# Patient Record
Sex: Female | Born: 1970 | Race: White | Hispanic: No | Marital: Married | Smoking: Never smoker
Health system: Southern US, Community
[De-identification: ages and names within clinical notes are randomized; demographics above are authoritative.]

## PROBLEM LIST (undated history)

## (undated) DIAGNOSIS — I8390 Asymptomatic varicose veins of unspecified lower extremity: Secondary | ICD-10-CM

## (undated) DIAGNOSIS — A6920 Lyme disease, unspecified: Secondary | ICD-10-CM

## (undated) DIAGNOSIS — Z8489 Family history of other specified conditions: Secondary | ICD-10-CM

## (undated) DIAGNOSIS — F419 Anxiety disorder, unspecified: Secondary | ICD-10-CM

## (undated) DIAGNOSIS — G4730 Sleep apnea, unspecified: Secondary | ICD-10-CM

## (undated) DIAGNOSIS — K219 Gastro-esophageal reflux disease without esophagitis: Secondary | ICD-10-CM

## (undated) DIAGNOSIS — Z8719 Personal history of other diseases of the digestive system: Secondary | ICD-10-CM

## (undated) HISTORY — DX: Lyme disease, unspecified: A69.20

## (undated) HISTORY — PX: NASAL SEPTUM SURGERY: SHX37

## (undated) HISTORY — DX: Asymptomatic varicose veins of unspecified lower extremity: I83.90

## (undated) HISTORY — PX: TEMPOROMANDIBULAR JOINT SURGERY: SHX35

---

## 1999-02-11 ENCOUNTER — Other Ambulatory Visit: Admission: RE | Admit: 1999-02-11 | Discharge: 1999-02-11 | Payer: Self-pay | Admitting: Obstetrics and Gynecology

## 1999-09-24 ENCOUNTER — Other Ambulatory Visit: Admission: RE | Admit: 1999-09-24 | Discharge: 1999-09-24 | Payer: Self-pay | Admitting: Obstetrics and Gynecology

## 2000-09-23 ENCOUNTER — Other Ambulatory Visit: Admission: RE | Admit: 2000-09-23 | Discharge: 2000-09-23 | Payer: Self-pay | Admitting: Obstetrics and Gynecology

## 2002-03-21 ENCOUNTER — Other Ambulatory Visit: Admission: RE | Admit: 2002-03-21 | Discharge: 2002-03-21 | Payer: Self-pay | Admitting: Obstetrics and Gynecology

## 2003-05-16 ENCOUNTER — Other Ambulatory Visit: Admission: RE | Admit: 2003-05-16 | Discharge: 2003-05-16 | Payer: Self-pay | Admitting: Obstetrics and Gynecology

## 2004-07-07 ENCOUNTER — Other Ambulatory Visit: Admission: RE | Admit: 2004-07-07 | Discharge: 2004-07-07 | Payer: Self-pay | Admitting: Obstetrics and Gynecology

## 2006-12-28 ENCOUNTER — Observation Stay (HOSPITAL_COMMUNITY): Admission: RE | Admit: 2006-12-28 | Discharge: 2006-12-29 | Payer: Self-pay | Admitting: Otolaryngology

## 2008-09-10 ENCOUNTER — Emergency Department (HOSPITAL_BASED_OUTPATIENT_CLINIC_OR_DEPARTMENT_OTHER): Admission: EM | Admit: 2008-09-10 | Discharge: 2008-09-10 | Payer: Self-pay | Admitting: Emergency Medicine

## 2008-09-10 ENCOUNTER — Ambulatory Visit: Payer: Self-pay | Admitting: Interventional Radiology

## 2010-05-16 LAB — D-DIMER, QUANTITATIVE: D-Dimer, Quant: 0.32 ug/mL-FEU (ref 0.00–0.48)

## 2010-05-16 LAB — CBC
HCT: 36.7 % (ref 36.0–46.0)
MCHC: 33.4 g/dL (ref 30.0–36.0)
MCV: 82.1 fL (ref 78.0–100.0)
Platelets: 248 10*3/uL (ref 150–400)
RDW: 13.6 % (ref 11.5–15.5)
WBC: 7.3 10*3/uL (ref 4.0–10.5)

## 2010-05-16 LAB — POCT CARDIAC MARKERS: Troponin i, poc: <0.05 ng/mL (ref 0.00–0.09)

## 2010-05-16 LAB — DIFFERENTIAL
Eosinophils Absolute: 0.1 10*3/uL (ref 0.0–0.7)
Eosinophils Relative: 2 % (ref 0–5)
Lymphs Abs: 3.3 10*3/uL (ref 0.7–4.0)

## 2010-06-23 NOTE — Op Note (Signed)
Michele Erickson, Michele Erickson                ACCOUNT NO.:  [id number]   MEDICAL RECORD NO.:  [id number]          PATIENT TYPE:  OBV   LOCATION:  3307                         FACILITY:  [facility]   PHYSICIAN:  Zola Button T. Lazarus Salines, M.D. DATE OF BIRTH:  Apr 27, 1970   DATE OF PROCEDURE:  12/28/2006  DATE OF DISCHARGE:  12/21/2006                               OPERATIVE REPORT   PREOPERATIVE DIAGNOSIS:  1. Obstructive sleep apnea.  2. Nasal septal deviation.  3. Hypertrophic inferior turbinates.   POSTOPERATIVE DIAGNOSIS:  1. Obstructive sleep apnea.  2. Nasal septal deviation.  3. Hypertrophic inferior turbinates.   PROCEDURE PERFORMED:  Nasal septoplasty, bilateral FMR inferior  turbinates.   SURGEON:  Gloris Manchester. Lazarus Salines, M.D.   ANESTHESIA:  General orotracheal.   BLOOD LOSS:  Minimal.   COMPLICATIONS:  None.   FINDINGS:  A severe buckled leftward septal deviation with a prominent  chondroethmoid spur and spurring along the maxillary crest as well.  Compensatory hypertrophy of the right inferior turbinate with moderate  hypertrophy of both inferior turbinates.   DESCRIPTION OF PROCEDURE:  With the patient in a comfortable supine  position, general orotracheal anesthesia was induced without difficulty.  At an appropriate level, the patient was placed in a slight sitting  position.  A saline-moistened throat pack was placed.  Nasal vibrissae  were trimmed.  Cocaine crystals, 200 mg total were applied on cotton  carriers to the anterior ethmoid and sphenopalatine ganglion regions on  both sides.  Cocaine solution, 160 mg total was applied on 1/2 x 3 inch  cottonoids to both sides of the septum.  Finally, 1% Xylocaine with  1:100,000 epinephrine, 10 mL total was infiltrated into the sub  mucoperichondrial plane of the septum on both sides, into the membranous  columella, into the anterior floor of the nose, and into the nasal spine  region.  Several minutes were allowed for this to take effect. A  sterile  preparation and draping in the mid face was accomplished.   The materials were removed from the nose and observed to be intact and  correct in number.  The findings were as described above.  A right-sided  approach was elected.   A small left floor incision was made and a right hemitransfixion  incision was executed and carried down into a floor incision. Floor  tunnels were elevated on both sides.  The sub mucoperichondrial plane of  the right septum was elevated up to the dorsum of the nose, back onto  the perpendicular plate of ethmoid and brought downward, communicated  with the floor tunnel and brought forward.  The right flap was raised  intact.  The chondroethmoid junction was identified and opened with a  Cottle elevator and the opposite sub mucoperiosteal plane of the  perpendicular plate of the septum was elevated.  The superior  perpendicular plate was lysed with an open Laren Boom forceps and  then piecemeal dissection more inferiorly allowed elevation of the  mucosa across the chondroethmoid spur which was then rocked free and  delivered. Additional bony spicules were carefully removed down to the  maxillary crest. Careful dissection at the junction of the perpendicular  plate with the quadrangular cartilage allowed better mobilization of the  quadrangular cartilage.  A small rent was made in the superior flap on  the left side which was felt to be not significant.   The posterior inferior corner of the quadrangular cartilage was  submucosally resected leaving a 3 cm dorsal strut and a 2 cm caudal  strut.  The inferior edge of the caudal strut was incised approximately  2 mm from the maxillary crest where it was heavily spurred and this also  was submucosally delivered.  The maxillary crest posterior to the caudal  strut was removed using mallet and osteotome.  At this point, the septum  still had a tendency to mobilize towards the left side.  Therefore,  the  septum was separated from the upper lateral cartilages sharply through  the tunnel on the right side and transmucosally on the left side.  This  allowed better mobilization.  The septum was secured to the nasal spine  with a figure-of-eight 4-0 PDS suture.  At this point,  the septum was  straight and in the midline.  The septal tunnels were suctioned free.  Hemostasis was observed.  The mucosal incisions were closed with  interrupted 4-0 chromic suture.   Just before completing the septoplasty, the inferior turbinates was  infiltrated with 1% Xylocaine with 1:100,000 epinephrine, 6 mL total.  After completing the septoplasty, beginning on the right side, the  anterior hood of the inferior turbinate was lysed just behind the nasal  valve.  The medial mucosa was incised in an anterior upsloping fashion  and a laterally based flap was developed.  The turbinate was  infractured.  Using angled turbinate scissors, the turbinate bone and  lateral mucosa were resected in a posterior downsloping fashion taking  virtually all the anterior pole and leaving virtually all the posterior  pole which was quite bulky. Dissection allowed delivery of additional  bony spicules.  The bulbous posterior pole of the inferior turbinate on  the cut mucosal edges were suction coagulated for hemostasis.  This  completed the turbinate which was outfractured.  The left side was done  in identical fashion.   At this point, 0.040 reinforced Silastic splints were fashioned and  placed into the nose and secured to the septum with a 3-0 Ethilon  stitch.  Again hemostasis was observed.  Measuring to the nasopharynx,  6.5 mm nasal trumpets were fashioned for each side.  A double thickness  bacitracin impregnated Telfa pack was applied along the inferior  turbinates on both sides and then the nasal trumpet was applied one on  each side to allow some postoperative airway.  At this point, the  procedure was completed.   Hemostasis was observed.  The pharynx was  suctioned clean and the throat pack was removed.  The patient was  returned to anesthesia, awakened, extubated, and transferred to recovery  in stable condition.   COMMENT:  A 40 year old white female with obstructive sleep apnea is a  candidate for septoplasty, reduction of turbinates, UPPP and  tonsillectomy.  The insurance company continues to refuse a  certification for the pharyngeal surgery.  The patient elected to go  ahead with the nasal surgery to see if this makes her CPAP tolerance  better, hence  the above procedure.  We will observe her 23 hours following anesthesia  given her severe obstructive sleep apnea and then remove the packs in  the  morning and allow her to go home if all is going well. Meanwhile  emphasize analgesia, antibiosis, ice, and elevation.  Will suction the  nasal tubes as needed to protect their patency.      Gloris Manchester. Lazarus Salines, M.D.  Electronically Signed     KTW/MEDQ  D:  12/28/2006  T:  12/28/2006  Job:  [id number]   cc:   Johnn Hai, MD

## 2010-11-17 LAB — CBC
HCT: 40.9
MCV: 85.9
RBC: 4.76
WBC: 7.2

## 2010-11-17 LAB — BASIC METABOLIC PANEL
BUN: 14
Chloride: 103
Potassium: 4.8

## 2010-12-03 ENCOUNTER — Other Ambulatory Visit: Payer: Self-pay | Admitting: Obstetrics and Gynecology

## 2010-12-03 DIAGNOSIS — R928 Other abnormal and inconclusive findings on diagnostic imaging of breast: Secondary | ICD-10-CM

## 2010-12-17 ENCOUNTER — Other Ambulatory Visit: Payer: Self-pay

## 2010-12-17 ENCOUNTER — Ambulatory Visit
Admission: RE | Admit: 2010-12-17 | Discharge: 2010-12-17 | Disposition: A | Discharge status: Home / Self Care | Payer: No Typology Code available for payment source | Source: Ambulatory Visit | Attending: Obstetrics and Gynecology | Admitting: Obstetrics and Gynecology

## 2010-12-17 DIAGNOSIS — R928 Other abnormal and inconclusive findings on diagnostic imaging of breast: Secondary | ICD-10-CM

## 2011-02-24 ENCOUNTER — Encounter (HOSPITAL_COMMUNITY): Payer: Self-pay | Admitting: *Deleted

## 2011-02-24 ENCOUNTER — Emergency Department (HOSPITAL_COMMUNITY)
Admission: EM | Admit: 2011-02-24 | Discharge: 2011-02-24 | Disposition: A | Discharge status: Home / Self Care | Payer: No Typology Code available for payment source | Attending: Emergency Medicine | Admitting: Emergency Medicine

## 2011-02-24 ENCOUNTER — Emergency Department (HOSPITAL_COMMUNITY): Payer: No Typology Code available for payment source

## 2011-02-24 DIAGNOSIS — S335XXA Sprain of ligaments of lumbar spine, initial encounter: Secondary | ICD-10-CM | POA: Insufficient documentation

## 2011-02-24 DIAGNOSIS — M542 Cervicalgia: Secondary | ICD-10-CM | POA: Insufficient documentation

## 2011-02-24 DIAGNOSIS — M545 Low back pain, unspecified: Secondary | ICD-10-CM | POA: Insufficient documentation

## 2011-02-24 DIAGNOSIS — S39012A Strain of muscle, fascia and tendon of lower back, initial encounter: Secondary | ICD-10-CM

## 2011-02-24 DIAGNOSIS — S139XXA Sprain of joints and ligaments of unspecified parts of neck, initial encounter: Secondary | ICD-10-CM | POA: Insufficient documentation

## 2011-02-24 DIAGNOSIS — S161XXA Strain of muscle, fascia and tendon at neck level, initial encounter: Secondary | ICD-10-CM

## 2011-02-24 HISTORY — DX: Anxiety disorder, unspecified: F41.9

## 2011-02-24 MED ORDER — HYDROCODONE-ACETAMINOPHEN 5-325 MG PO TABS: 1.0000 | ORAL_TABLET | Freq: Four times a day (QID) | ORAL | Status: AC | PRN

## 2011-02-24 MED ORDER — IBUPROFEN 800 MG PO TABS: 800.0000 mg | ORAL_TABLET | Freq: Three times a day (TID) | ORAL | Status: AC | PRN

## 2011-02-24 MED ORDER — CYCLOBENZAPRINE HCL 10 MG PO TABS: 10.0000 mg | ORAL_TABLET | Freq: Three times a day (TID) | ORAL | Status: AC | PRN

## 2011-02-24 NOTE — ED Provider Notes (Signed)
Medical screening examination/treatment/procedure(s) were performed by non-physician practitioner and as supervising physician I was immediately available for consultation/collaboration.   Dione Booze, MD 02/24/11 [phone]
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
## 2013-09-20 ENCOUNTER — Ambulatory Visit (INDEPENDENT_AMBULATORY_CARE_PROVIDER_SITE_OTHER): Payer: BC Managed Care – HMO | Admitting: General Surgery

## 2013-10-09 ENCOUNTER — Ambulatory Visit (INDEPENDENT_AMBULATORY_CARE_PROVIDER_SITE_OTHER): Payer: BC Managed Care – HMO | Admitting: General Surgery

## 2013-10-29 ENCOUNTER — Ambulatory Visit (HOSPITAL_COMMUNITY)
Admission: RE | Admit: 2013-10-29 | Discharge: 2013-10-29 | Disposition: A | Discharge status: Home / Self Care | Payer: BC Managed Care – PPO | Source: Ambulatory Visit | Attending: Gastroenterology | Admitting: Gastroenterology

## 2013-10-29 ENCOUNTER — Encounter (HOSPITAL_COMMUNITY)
Admission: RE | Disposition: A | Discharge status: Home / Self Care | Payer: Self-pay | Source: Ambulatory Visit | Attending: Gastroenterology

## 2013-10-29 DIAGNOSIS — K219 Gastro-esophageal reflux disease without esophagitis: Secondary | ICD-10-CM | POA: Diagnosis not present

## 2013-10-29 DIAGNOSIS — R12 Heartburn: Secondary | ICD-10-CM | POA: Insufficient documentation

## 2013-10-29 HISTORY — PX: 24 HOUR PH STUDY: SHX5419

## 2013-10-29 SURGERY — MONITORING, ESOPHAGEAL PH, 24 HOUR

## 2013-10-29 MED ORDER — LIDOCAINE VISCOUS 2 % MT SOLN
OROMUCOSAL | Status: AC
  Filled 2013-10-29: qty 15

## 2013-10-30 ENCOUNTER — Encounter (HOSPITAL_COMMUNITY): Payer: Self-pay | Admitting: Gastroenterology

## 2013-11-14 ENCOUNTER — Other Ambulatory Visit (INDEPENDENT_AMBULATORY_CARE_PROVIDER_SITE_OTHER): Payer: Self-pay | Admitting: General Surgery

## 2013-12-19 NOTE — Patient Instructions (Addendum)
Michele Erickson  12/19/2013   Your procedure is scheduled on:  12/27/13                Come thru the [facility] Entrance.    Follow the Signs to Short Stay Center at  0700      am  Call this number if you have problems the morning of surgery: [phone]   Remember:   Do not eat food or drink liquids after midnight.   Take these medicines the morning of surgery with A SIP OF WATER: none    Do not wear jewelry, make-up or nail polish.  Do not wear lotions, powders, or perfumes.  deodorant.  Do not shave 48 hours prior to surgery.   Do not bring valuables to the hospital.  Contacts, dentures or bridgework may not be worn into surgery.  Leave suitcase in the car. After surgery it may be brought to your room.  For patients admitted to the hospital, checkout time is 11:00 AM the day of  discharge.         Please read over the following fact sheets that you were given: coughing and deep breathing exercises, leg exercises            [place] - Preparing for Surgery Before surgery, you can play an important role.  Because skin is not sterile, your skin needs to be as free of germs as possible.  You can reduce the number of germs on your skin by washing with CHG (chlorahexidine gluconate) soap before surgery.  CHG is an antiseptic cleaner which kills germs and bonds with the skin to continue killing germs even after washing. Please DO NOT use if you have an allergy to CHG or antibacterial soaps.  If your skin becomes reddened/irritated stop using the CHG and inform your nurse when you arrive at Short Stay. Do not shave (including legs and underarms) for at least 48 hours prior to the first CHG shower.  You may shave your face/neck. Please follow these instructions carefully:  1.  Shower with CHG Soap the night before surgery and the  morning of Surgery.  2.  If you choose to wash your hair, wash your hair first as usual with your  normal  shampoo.  3.  After you shampoo, rinse your hair  and body thoroughly to remove the  shampoo.                           4.  Use CHG as you would any other liquid soap.  You can apply chg directly  to the skin and wash                       Gently with a scrungie or clean washcloth.  5.  Apply the CHG Soap to your body ONLY FROM THE NECK DOWN.   Do not use on face/ open                           Wound or open sores. Avoid contact with eyes, ears mouth and genitals (private parts).                       Wash face,  Genitals (private parts) with your normal soap.             6.  Wash thoroughly, paying special attention to  the area where your surgery  will be performed.  7.  Thoroughly rinse your body with warm water from the neck down.  8.  DO NOT shower/wash with your normal soap after using and rinsing off  the CHG Soap.                9.  Pat yourself dry with a clean towel.            10.  Wear clean pajamas.            11.  Place clean sheets on your bed the night of your first shower and do not  sleep with pets. Day of Surgery : Do not apply any lotions/deodorants the morning of surgery.  Please wear clean clothes to the hospital/surgery center.  FAILURE TO FOLLOW THESE INSTRUCTIONS MAY RESULT IN THE CANCELLATION OF YOUR SURGERY PATIENT SIGNATURE_________________________________  NURSE SIGNATURE__________________________________  ________________________________________________________________________  WHAT IS A BLOOD TRANSFUSION? Blood Transfusion Information  A transfusion is the replacement of blood or some of its parts. Blood is made up of multiple cells which provide different functions.  Red blood cells carry oxygen and are used for blood loss replacement.  White blood cells fight against infection.  Platelets control bleeding.  Plasma helps clot blood.  Other blood products are available for specialized needs, such as hemophilia or other clotting disorders. BEFORE THE TRANSFUSION  Who gives blood for transfusions?    Healthy volunteers who are fully evaluated to make sure their blood is safe. This is blood bank blood. Transfusion therapy is the safest it has ever been in the practice of medicine. Before blood is taken from a donor, a complete history is taken to make sure that person has no history of diseases nor engages in risky social behavior (examples are intravenous drug use or sexual activity with multiple partners). The donor's travel history is screened to minimize risk of transmitting infections, such as malaria. The donated blood is tested for signs of infectious diseases, such as HIV and hepatitis. The blood is then tested to be sure it is compatible with you in order to minimize the chance of a transfusion reaction. If you or a relative donates blood, this is often done in anticipation of surgery and is not appropriate for emergency situations. It takes many days to process the donated blood. RISKS AND COMPLICATIONS Although transfusion therapy is very safe and saves many lives, the main dangers of transfusion include:   Getting an infectious disease.  Developing a transfusion reaction. This is an allergic reaction to something in the blood you were given. Every precaution is taken to prevent this. The decision to have a blood transfusion has been considered carefully by your caregiver before blood is given. Blood is not given unless the benefits outweigh the risks. AFTER THE TRANSFUSION  Right after receiving a blood transfusion, you will usually feel much better and more energetic. This is especially true if your red blood cells have gotten low (anemic). The transfusion raises the level of the red blood cells which carry oxygen, and this usually causes an energy increase.  The nurse administering the transfusion will monitor you carefully for complications. HOME CARE INSTRUCTIONS  No special instructions are needed after a transfusion. You may find your energy is better. Speak with your  caregiver about any limitations on activity for underlying diseases you may have. SEEK MEDICAL CARE IF:   Your condition is not improving after your transfusion.  You develop redness or irritation at  the intravenous (IV) site. SEEK IMMEDIATE MEDICAL CARE IF:  Any of the following symptoms occur over the next 12 hours:  Shaking chills.  You have a temperature by mouth above 102 F (38.9 C), not controlled by medicine.  Chest, back, or muscle pain.  People around you feel you are not acting correctly or are confused.  Shortness of breath or difficulty breathing.  Dizziness and fainting.  You get a rash or develop hives.  You have a decrease in urine output.  Your urine turns a dark color or changes to pink, red, or brown. Any of the following symptoms occur over the next 10 days:  You have a temperature by mouth above 102 F (38.9 C), not controlled by medicine.  Shortness of breath.  Weakness after normal activity.  The white part of the eye turns yellow (jaundice).  You have a decrease in the amount of urine or are urinating less often.  Your urine turns a dark color or changes to pink, red, or brown. Document Released: 01/23/2000 Document Revised: 04/19/2011 Document Reviewed: 09/11/2007 [facility] Patient Information 2014 [place], [place].  _______________________________________________________________________

## 2013-12-20 ENCOUNTER — Encounter (HOSPITAL_COMMUNITY)
Admission: RE | Admit: 2013-12-20 | Discharge: 2013-12-20 | Disposition: A | Discharge status: Home / Self Care | Payer: BC Managed Care – PPO | Source: Ambulatory Visit | Attending: General Surgery | Admitting: General Surgery

## 2013-12-20 ENCOUNTER — Encounter (HOSPITAL_COMMUNITY): Payer: Self-pay

## 2013-12-20 DIAGNOSIS — Z01812 Encounter for preprocedural laboratory examination: Secondary | ICD-10-CM | POA: Diagnosis not present

## 2013-12-20 HISTORY — DX: Gastro-esophageal reflux disease without esophagitis: K21.9

## 2013-12-20 HISTORY — DX: Family history of other specified conditions: Z84.89

## 2013-12-20 HISTORY — DX: Sleep apnea, unspecified: G47.30

## 2013-12-20 HISTORY — DX: Personal history of other diseases of the digestive system: Z87.19

## 2013-12-20 LAB — CBC WITH DIFFERENTIAL/PLATELET
BASOS ABS: 0 10*3/uL (ref 0.0–0.1)
Basophils Relative: 0 % (ref 0–1)
EOS ABS: 0.1 10*3/uL (ref 0.0–0.7)
EOS PCT: 1 % (ref 0–5)
HEMATOCRIT: 41.4 % (ref 36.0–46.0)
Hemoglobin: 13.8 g/dL (ref 12.0–15.0)
LYMPHS PCT: 32 % (ref 12–46)
Lymphs Abs: 2.2 10*3/uL (ref 0.7–4.0)
MCH: 28.2 pg (ref 26.0–34.0)
MCHC: 33.3 g/dL (ref 30.0–36.0)
MCV: 84.7 fL (ref 78.0–100.0)
MONO ABS: 0.4 10*3/uL (ref 0.1–1.0)
Monocytes Relative: 6 % (ref 3–12)
Neutro Abs: 4.1 10*3/uL (ref 1.7–7.7)
Neutrophils Relative %: 61 % (ref 43–77)
Platelets: 227 10*3/uL (ref 150–400)
RBC: 4.89 MIL/uL (ref 3.87–5.11)
RDW: 15.3 % (ref 11.5–15.5)
WBC: 6.7 10*3/uL (ref 4.0–10.5)

## 2013-12-20 LAB — COMPREHENSIVE METABOLIC PANEL
ALT: 13 U/L (ref 0–35)
ANION GAP: 12 (ref 5–15)
AST: 14 U/L (ref 0–37)
Albumin: 4.5 g/dL (ref 3.5–5.2)
Alkaline Phosphatase: 62 U/L (ref 39–117)
BUN: 14 mg/dL (ref 6–23)
CALCIUM: 9.8 mg/dL (ref 8.4–10.5)
CO2: 26 mEq/L (ref 19–32)
CREATININE: 0.85 mg/dL (ref 0.50–1.10)
Chloride: 103 mEq/L (ref 96–112)
GFR calc non Af Amer: 82 mL/min — ABNORMAL LOW (ref >90)
GLUCOSE: 85 mg/dL (ref 70–99)
Potassium: 4.2 mEq/L (ref 3.7–5.3)
Sodium: 141 mEq/L (ref 137–147)
TOTAL PROTEIN: 8.2 g/dL (ref 6.0–8.3)
Total Bilirubin: 0.4 mg/dL (ref 0.3–1.2)

## 2013-12-20 LAB — HCG, SERUM, QUALITATIVE: Preg, Serum: NEGATIVE

## 2013-12-20 LAB — ABO/RH: ABO/RH(D): O POS

## 2013-12-20 LAB — PROTIME-INR
INR: 0.98 (ref 0.00–1.49)
Prothrombin Time: 13.1 s (ref 11.6–15.2)

## 2013-12-20 NOTE — Progress Notes (Signed)
Temp 99.7 at time of preop appointment.   Patient would prefer " to have no injections for blood clots".  After surgery.

## 2013-12-27 ENCOUNTER — Observation Stay (HOSPITAL_COMMUNITY)
Admission: RE | Admit: 2013-12-27 | Discharge: 2013-12-28 | Disposition: A | Discharge status: Home / Self Care | Payer: BC Managed Care – PPO | Source: Ambulatory Visit | Attending: General Surgery | Admitting: General Surgery

## 2013-12-27 ENCOUNTER — Encounter (HOSPITAL_COMMUNITY): Payer: Self-pay | Admitting: *Deleted

## 2013-12-27 ENCOUNTER — Ambulatory Visit (HOSPITAL_COMMUNITY): Payer: BC Managed Care – PPO | Admitting: Certified Registered Nurse Anesthetist

## 2013-12-27 ENCOUNTER — Encounter (HOSPITAL_COMMUNITY)
Admission: RE | Disposition: A | Discharge status: Home / Self Care | Payer: Self-pay | Source: Ambulatory Visit | Attending: General Surgery

## 2013-12-27 DIAGNOSIS — G4730 Sleep apnea, unspecified: Secondary | ICD-10-CM | POA: Insufficient documentation

## 2013-12-27 DIAGNOSIS — K219 Gastro-esophageal reflux disease without esophagitis: Secondary | ICD-10-CM | POA: Diagnosis present

## 2013-12-27 DIAGNOSIS — M549 Dorsalgia, unspecified: Secondary | ICD-10-CM | POA: Diagnosis not present

## 2013-12-27 DIAGNOSIS — K449 Diaphragmatic hernia without obstruction or gangrene: Secondary | ICD-10-CM | POA: Insufficient documentation

## 2013-12-27 HISTORY — PX: LAPAROSCOPIC NISSEN FUNDOPLICATION: SHX1932

## 2013-12-27 LAB — TYPE AND SCREEN
ABO/RH(D): O POS
Antibody Screen: NEGATIVE

## 2013-12-27 SURGERY — FUNDOPLICATION, NISSEN, LAPAROSCOPIC
Anesthesia: General

## 2013-12-27 MED ORDER — FENTANYL CITRATE 0.05 MG/ML IJ SOLN
INTRAMUSCULAR | Status: AC
  Filled 2013-12-27: qty 5

## 2013-12-27 MED ORDER — ONDANSETRON HCL 4 MG/2ML IJ SOLN: 4.0000 mg | INTRAMUSCULAR | Status: DC | PRN

## 2013-12-27 MED ORDER — ONDANSETRON HCL 4 MG/2ML IJ SOLN
INTRAMUSCULAR | Status: DC | PRN
  Administered 2013-12-27: 4 mg via INTRAVENOUS

## 2013-12-27 MED ORDER — PROMETHAZINE HCL 25 MG/ML IJ SOLN: 6.2500 mg | INTRAMUSCULAR | Status: DC | PRN

## 2013-12-27 MED ORDER — ONDANSETRON HCL 4 MG/2ML IJ SOLN
INTRAMUSCULAR | Status: AC
  Filled 2013-12-27: qty 2

## 2013-12-27 MED ORDER — FENTANYL CITRATE 0.05 MG/ML IJ SOLN
INTRAMUSCULAR | Status: DC | PRN
  Administered 2013-12-27 (×5): 50 ug via INTRAVENOUS

## 2013-12-27 MED ORDER — GLYCOPYRROLATE 0.2 MG/ML IJ SOLN
INTRAMUSCULAR | Status: AC
  Filled 2013-12-27: qty 3

## 2013-12-27 MED ORDER — VANCOMYCIN HCL IN DEXTROSE 1-5 GM/200ML-% IV SOLN
1000.0000 mg | INTRAVENOUS | Status: AC
  Administered 2013-12-27: 1000 mg via INTRAVENOUS

## 2013-12-27 MED ORDER — ROCURONIUM BROMIDE 100 MG/10ML IV SOLN
INTRAVENOUS | Status: AC
  Filled 2013-12-27: qty 1

## 2013-12-27 MED ORDER — LIDOCAINE HCL (CARDIAC) 20 MG/ML IV SOLN
INTRAVENOUS | Status: AC
  Filled 2013-12-27: qty 5

## 2013-12-27 MED ORDER — LACTATED RINGERS IR SOLN
Status: DC | PRN
  Administered 2013-12-27: 1

## 2013-12-27 MED ORDER — HYDROMORPHONE HCL 1 MG/ML IJ SOLN
INTRAMUSCULAR | Status: AC
  Filled 2013-12-27: qty 1

## 2013-12-27 MED ORDER — PROPOFOL 10 MG/ML IV BOLUS
INTRAVENOUS | Status: AC
  Filled 2013-12-27: qty 20

## 2013-12-27 MED ORDER — PROPOFOL 10 MG/ML IV BOLUS
INTRAVENOUS | Status: DC | PRN
  Administered 2013-12-27: 150 mg via INTRAVENOUS

## 2013-12-27 MED ORDER — 0.9 % SODIUM CHLORIDE (POUR BTL) OPTIME
TOPICAL | Status: DC | PRN
  Administered 2013-12-27: 1000 mL

## 2013-12-27 MED ORDER — NEOSTIGMINE METHYLSULFATE 10 MG/10ML IV SOLN
INTRAVENOUS | Status: AC
  Filled 2013-12-27: qty 1

## 2013-12-27 MED ORDER — VANCOMYCIN HCL IN DEXTROSE 1-5 GM/200ML-% IV SOLN
INTRAVENOUS | Status: AC
  Filled 2013-12-27: qty 200

## 2013-12-27 MED ORDER — LIDOCAINE HCL (CARDIAC) 20 MG/ML IV SOLN
INTRAVENOUS | Status: DC | PRN
  Administered 2013-12-27: 70 mg via INTRAVENOUS
  Administered 2013-12-27: 30 mg via INTRAVENOUS

## 2013-12-27 MED ORDER — ROCURONIUM BROMIDE 100 MG/10ML IV SOLN
INTRAVENOUS | Status: DC | PRN
  Administered 2013-12-27: 10 mg via INTRAVENOUS
  Administered 2013-12-27: 40 mg via INTRAVENOUS

## 2013-12-27 MED ORDER — BUPIVACAINE-EPINEPHRINE (PF) 0.5% -1:200000 IJ SOLN
INTRAMUSCULAR | Status: DC | PRN
  Administered 2013-12-27: 11 mL via PERINEURAL

## 2013-12-27 MED ORDER — OXYCODONE HCL 5 MG PO TABS
5.0000 mg | ORAL_TABLET | ORAL | Status: DC | PRN
  Administered 2013-12-27 – 2013-12-28 (×5): 10 mg via ORAL
  Filled 2013-12-27 (×5): qty 2

## 2013-12-27 MED ORDER — LACTATED RINGERS IV SOLN
INTRAVENOUS | Status: DC
  Administered 2013-12-27: 11:00:00 via INTRAVENOUS
  Administered 2013-12-27: 1000 mL via INTRAVENOUS
  Administered 2013-12-27: 10:00:00 via INTRAVENOUS

## 2013-12-27 MED ORDER — PROMETHAZINE HCL 25 MG/ML IJ SOLN: 12.5000 mg | INTRAMUSCULAR | Status: DC | PRN

## 2013-12-27 MED ORDER — ONDANSETRON HCL 4 MG/2ML IJ SOLN
4.0000 mg | INTRAMUSCULAR | Status: AC
  Administered 2013-12-27 (×3): 4 mg via INTRAVENOUS
  Filled 2013-12-27 (×4): qty 2

## 2013-12-27 MED ORDER — BUPIVACAINE-EPINEPHRINE (PF) 0.5% -1:200000 IJ SOLN
INTRAMUSCULAR | Status: AC
  Filled 2013-12-27: qty 30

## 2013-12-27 MED ORDER — KETOROLAC TROMETHAMINE 30 MG/ML IJ SOLN
15.0000 mg | Freq: Once | INTRAMUSCULAR | Status: AC | PRN
  Administered 2013-12-27: 30 mg via INTRAVENOUS

## 2013-12-27 MED ORDER — SUCCINYLCHOLINE CHLORIDE 20 MG/ML IJ SOLN
INTRAMUSCULAR | Status: DC | PRN
  Administered 2013-12-27: 100 mg via INTRAVENOUS

## 2013-12-27 MED ORDER — NEOSTIGMINE METHYLSULFATE 10 MG/10ML IV SOLN
INTRAVENOUS | Status: DC | PRN
  Administered 2013-12-27: 3.5 mg via INTRAVENOUS

## 2013-12-27 MED ORDER — MIDAZOLAM HCL 2 MG/2ML IJ SOLN
INTRAMUSCULAR | Status: AC
  Filled 2013-12-27: qty 2

## 2013-12-27 MED ORDER — GLYCOPYRROLATE 0.2 MG/ML IJ SOLN
INTRAMUSCULAR | Status: DC | PRN
  Administered 2013-12-27: 0.6 mg via INTRAVENOUS

## 2013-12-27 MED ORDER — MORPHINE SULFATE 2 MG/ML IJ SOLN: 2.0000 mg | INTRAMUSCULAR | Status: DC | PRN

## 2013-12-27 MED ORDER — MIDAZOLAM HCL 5 MG/5ML IJ SOLN
INTRAMUSCULAR | Status: DC | PRN
  Administered 2013-12-27: 2 mg via INTRAVENOUS

## 2013-12-27 MED ORDER — DEXAMETHASONE SODIUM PHOSPHATE 10 MG/ML IJ SOLN
INTRAMUSCULAR | Status: AC
  Filled 2013-12-27: qty 1

## 2013-12-27 MED ORDER — ONDANSETRON HCL 4 MG PO TABS: 4.0000 mg | ORAL_TABLET | ORAL | Status: AC

## 2013-12-27 MED ORDER — HYDROMORPHONE HCL 1 MG/ML IJ SOLN
0.2500 mg | INTRAMUSCULAR | Status: DC | PRN
  Administered 2013-12-27 (×4): 0.5 mg via INTRAVENOUS

## 2013-12-27 MED ORDER — KETOROLAC TROMETHAMINE 30 MG/ML IJ SOLN
INTRAMUSCULAR | Status: AC
  Filled 2013-12-27: qty 1

## 2013-12-27 MED ORDER — KCL-LACTATED RINGERS-D5W 20 MEQ/L IV SOLN
INTRAVENOUS | Status: DC
  Administered 2013-12-27: 100 mL/h via INTRAVENOUS
  Administered 2013-12-28: 03:00:00 via INTRAVENOUS
  Filled 2013-12-27 (×4): qty 1000

## 2013-12-27 MED ORDER — DEXAMETHASONE SODIUM PHOSPHATE 10 MG/ML IJ SOLN
INTRAMUSCULAR | Status: DC | PRN
  Administered 2013-12-27: 10 mg via INTRAVENOUS

## 2013-12-27 SURGICAL SUPPLY — 51 items
APL SKNCLS STERI-STRIP NONHPOA (GAUZE/BANDAGES/DRESSINGS) ×1
APPLIER CLIP ROT 10 11.4 M/L (STAPLE)
APR CLP MED LRG 11.4X10 (STAPLE)
BENZOIN TINCTURE PRP APPL 2/3 (GAUZE/BANDAGES/DRESSINGS) ×2 IMPLANT
CANISTER SUCTION 2500CC (MISCELLANEOUS) ×1 IMPLANT
CHLORAPREP W/TINT 26ML (MISCELLANEOUS) ×1 IMPLANT
CLAMP ENDO BABCK 10MM (STAPLE) IMPLANT
CLIP APPLIE ROT 10 11.4 M/L (STAPLE) IMPLANT
DECANTER SPIKE VIAL GLASS SM (MISCELLANEOUS) ×1 IMPLANT
DEVICE SUT QUICK LOAD TK 5 (STAPLE) ×6 IMPLANT
DEVICE SUT TI-KNOT TK 5X26 (MISCELLANEOUS) ×1 IMPLANT
DEVICE SUTURE ENDOST 10MM (ENDOMECHANICALS) ×2 IMPLANT
DISSECTOR BLUNT TIP ENDO 5MM (MISCELLANEOUS) ×2 IMPLANT
DRAIN PENROSE 18X1/2 LTX STRL (DRAIN) ×2 IMPLANT
DRAPE LAPAROSCOPIC ABDOMINAL (DRAPES) ×2 IMPLANT
DRSG TEGADERM 2-3/8X2-3/4 SM (GAUZE/BANDAGES/DRESSINGS) ×12 IMPLANT
ELECT REM PT RETURN 9FT ADLT (ELECTROSURGICAL) ×2
ELECTRODE REM PT RTRN 9FT ADLT (ELECTROSURGICAL) ×1 IMPLANT
FELT TEFLON 4 X1 (Mesh General) ×2 IMPLANT
FILTER SMOKE EVAC LAPAROSHD (FILTER) ×2 IMPLANT
GLOVE ECLIPSE 8.0 STRL XLNG CF (GLOVE) ×4 IMPLANT
GLOVE INDICATOR 8.0 STRL GRN (GLOVE) ×4 IMPLANT
GOWN STRL REUS W/TWL LRG LVL3 (GOWN DISPOSABLE) ×2 IMPLANT
GOWN STRL REUS W/TWL XL LVL3 (GOWN DISPOSABLE) ×6 IMPLANT
GRASPER ENDO BABCOCK 10 (MISCELLANEOUS) IMPLANT
GRASPER ENDO BABCOCK 10MM (MISCELLANEOUS)
KIT BASIN OR (CUSTOM PROCEDURE TRAY) ×2 IMPLANT
NS IRRIG 1000ML POUR BTL (IV SOLUTION) ×2 IMPLANT
PENCIL BUTTON HOLSTER BLD 10FT (ELECTRODE) IMPLANT
RETRACTOR LAPSCP 12X46 CVD (ENDOMECHANICALS) IMPLANT
RTRCTR LAPSCP 12X46 CVD (ENDOMECHANICALS)
SET IRRIG TUBING LAPAROSCOPIC (IRRIGATION / IRRIGATOR) ×2 IMPLANT
SHEARS HARMONIC ACE PLUS 36CM (ENDOMECHANICALS) ×2 IMPLANT
SOLUTION ANTI FOG 6CC (MISCELLANEOUS) ×2 IMPLANT
STAPLER VISISTAT 35W (STAPLE) IMPLANT
STRIP CLOSURE SKIN 1/2X4 (GAUZE/BANDAGES/DRESSINGS) IMPLANT
SUT MNCRL AB 4-0 PS2 18 (SUTURE) ×2 IMPLANT
SUT SURGIDAC NAB ES-9 0 48 120 (SUTURE) ×9 IMPLANT
TIP INNERVISION DETACH 40FR (MISCELLANEOUS) IMPLANT
TIP INNERVISION DETACH 50FR (MISCELLANEOUS) IMPLANT
TIP INNERVISION DETACH 56FR (MISCELLANEOUS) ×2 IMPLANT
TIPS INNERVISION DETACH 40FR (MISCELLANEOUS)
TOWEL OR 17X26 10 PK STRL BLUE (TOWEL DISPOSABLE) ×4 IMPLANT
TOWEL OR NON WOVEN STRL DISP B (DISPOSABLE) ×2 IMPLANT
TRAY FOLEY CATH 14FRSI W/METER (CATHETERS) ×3 IMPLANT
TRAY LAPAROSCOPIC (CUSTOM PROCEDURE TRAY) ×2 IMPLANT
TROCAR BLADELESS OPT 5 75 (ENDOMECHANICALS) ×4 IMPLANT
TROCAR XCEL BLUNT TIP 100MML (ENDOMECHANICALS) ×1 IMPLANT
TROCAR XCEL NON-BLD 11X100MML (ENDOMECHANICALS) ×2 IMPLANT
TROCAR XCEL UNIV SLVE 11M 100M (ENDOMECHANICALS) ×3 IMPLANT
TUBING INSUFFLATION 10FT LAP (TUBING) ×2 IMPLANT

## 2013-12-27 NOTE — Op Note (Signed)
Operative Note  Michele BroadSabrina A Erickson female 43 y.o. 12/27/2013  PREOPERATIVE DX:  GERD  POSTOPERATIVE DX:  Same with small hiatal hernia.  PROCEDURE:   Laparoscopic hiatal hernia repair and Nissen fundoplication         Surgeon: Adolph PollackOSENBOWER,Allan Minotti J   Assistants: Karie SodaSteven Gross, M.D.  Anesthesia: General endotracheal anesthesia  Indications:   This is a 43 year old female with medically refractory GERD who present for the above operation.    Procedure Detail:  She was brought to the operating room placed supine on the operating table and a general anesthetic was administered. An oral gastric tube and Foley catheter were inserted. The abdominal wall was widely sterilely prepped and draped.  A 5 mm incision was made in the left subcostal area and she was placed in slight reverse Trendelenburg position. Using a 5 mm Optiview trocar and laparoscope access was gained into the peritoneal cavity and a pneumoperitoneum was created. Inspection of the area under the trocar demonstrated no evidence of organ injury or bleeding. Under direct laparoscopic vision a 5 mm trocar was placed in the right upper quadrant.  An 11 mm trocar was placed just to the left of the umbilicus. An 11 mm trocar placed through an epigastric incision. A 5 mm trocar placed in the left upper quadrant lateral to the first left upper quadrant trocar. A 5 mm incision was made in the subxiphoid area and a liver retractor was placed through that incision.  The left lobe of the liver was retracted anteriorly exposing the hiatus. A small hiatal hernia was noted.  The thin gastrohepatic ligament was divided with the Harmonic scalpel up to the level of the right crus. The phrenoesophageal ligament was divided  and dissected free from the anterior esophagus.  Using blunt dissection, a retroesophageal window was formed. The short gastric vessels of the fundus were divided mobilizing the fundus. The small hiatal hernia was repaired with a single,  pledgeted, 0 nonabsorbable suture.  The fundus was passed through the retroesophageal window and a 360 fundoplication was performed over a size 56 bougie with 3 interrupted size 0 sutures incorporating both sides of the wrap as well as a small bit of esophagus. The bougie was removed intact. The wrap was under no tension. The wrap was floppy. The wrap measured approximately 2-2.5 cm.  4 quadrant inspection was performed as well as a central inspection. There was no evidence of bleeding or organ injury. The liver retractor was removed. The CO2 gas was released and the trocars were removed.  The trocar site incisions were closed with 4-0 Monocryl subcuticular stitches. Steri-Strips and sterile dressings were applied. She tolerated the procedure well without any apparent complications and was taken to the curb and satisfactory condition.  Estimated Blood Loss:  less than 100 mL               Complications:  * No complications entered in OR log *         Disposition: PACU - hemodynamically stable.         Condition: stable

## 2013-12-27 NOTE — H&P (Signed)
She was referred by Dr. Dorena CookeyJohn Hayes to discuss surgical management of chronic gastroesophageal reflux disease. She has had this for approximately 20 years. initially, she responded well to medical treatment. More recently, she's become relatively refractory to medical treatment. Without the medication, she has severe heartburn. She has no vomiting but recently has begin to regurgitate food sometimes. She does have some early fullness. She has lost weight on purpose to try to help with the problem. No excessive flatulence. No excessive belching. No diarrhea. Her mother and sister have Barrett's esophagus. Recent endoscopy did not demonstrate Barrett's esophagus in her or severe esophagitis. Manometry did not demonstrate a motility disorder.  DeMeester score on pH probe study was 34 with normal being 14.72.  Other Problems  Back Pain Gastroesophageal Reflux Disease Sleep Apnea  Past Surgical History  Cesarean Section - Multiple Oral Surgery Nasal surgery TMJ surgery  Diagnostic Studies History  Colonoscopy never Mammogram within last year Pap Smear 1-5 years ago  Allergies  Demerol *ANALGESICS - OPIOID* Cephalexin *CEPHALOSPORINS*  Medication History  PriLOSEC OTC (20MG  Tablet DR, Oral as needed) Active. Vitamin C & D3/Rose Hips [phone] -UNIT-MG Capsule, Oral daily) Active. Vitamin D (Cholecalciferol) (1000UNIT Tablet, Oral daily) Active. Multivitamin & Mineral (Oral daily) Active.  Social History  Caffeine use Tea. No alcohol use No drug use Tobacco use Never smoker.  Family History  Alcohol Abuse Father, Sister. Cancer Family Members In General. Cerebrovascular Accident Family Members In General. Heart Disease Father. Heart disease in female family member before age 45 Hypertension Family Members In General. Melanoma Father. Migraine Headache Mother. Respiratory Condition Family Members In General, Father.  Pregnancy / Birth History   Age at menarche 10 years. Contraceptive History Oral contraceptives. Gravida 2 Maternal age 45-25 Para 2 Regular periods   Review of Systems  General Present- Weight Loss. Not Present- Appetite Loss, Chills, Fatigue, Fever, Night Sweats and Weight Gain. Skin Not Present- Change in Wart/Mole, Dryness, Hives, Jaundice, New Lesions, Non-Healing Wounds, Rash and Ulcer. HEENT Not Present- Earache, Hearing Loss, Hoarseness, Nose Bleed, Oral Ulcers, Ringing in the Ears, Seasonal Allergies, Sinus Pain, Sore Throat, Visual Disturbances, Wears glasses/contact lenses and Yellow Eyes. Respiratory Not Present- Bloody sputum, Chronic Cough, Difficulty Breathing, Snoring and Wheezing. Breast Not Present- Breast Mass, Breast Pain, Nipple Discharge and Skin Changes. Cardiovascular Not Present- Chest Pain, Difficulty Breathing Lying Down, Leg Cramps, Palpitations, Rapid Heart Rate, Shortness of Breath and Swelling of Extremities. Gastrointestinal Present- Gets full quickly at meals. Not Present- Abdominal Pain, Bloating, Bloody Stool, Change in Bowel Habits, Chronic diarrhea, Constipation, Difficulty Swallowing, Excessive gas, Hemorrhoids, Indigestion, Nausea, Rectal Pain and Vomiting. Female Genitourinary Not Present- Frequency, Nocturia, Painful Urination, Pelvic Pain and Urgency. Musculoskeletal Present- Back Pain. Not Present- Joint Pain, Joint Stiffness, Muscle Pain, Muscle Weakness and Swelling of Extremities. Neurological Not Present- Decreased Memory, Fainting, Headaches, Numbness, Seizures, Tingling, Tremor, Trouble walking and Weakness. Psychiatric Not Present- Anxiety, Bipolar, Change in Sleep Pattern, Depression, Fearful and Frequent crying. Endocrine Not Present- Cold Intolerance, Excessive Hunger, Hair Changes, Heat Intolerance, Hot flashes and New Diabetes. Hematology Not Present- Easy Bruising, Excessive bleeding, Gland problems, HIV and Persistent Infections.   Physical Exam Adolph Pollack(Brittaney Beaulieu J.  Hayk Divis MD; 10/09/2013 2:33 PM) The physical exam findings are as follows: Note:General: WDWN in NAD. Pleasant and cooperative.  HEENT: [place]/AT, no facial masses  EYES: EOMI, no icterus  NECK: Supple, no obvious mass or thyroid enlargement.  CV: RRR, no murmur, no JVD.  CHEST: Breath sounds equal and clear. Respirations nonlabored.  ABDOMEN:  Soft, nontender, nondistended, no masses, no organomegaly, active bowel sounds, lower transverse scar, no hernias.  MUSCULOSKELETAL: FROM, good muscle tone, no edema, no venous stasis changes  LYMPHATIC: No palpable cervical, supraclavicular, adenopathy  SKIN: No jaundice or suspicious rashes.  NEUROLOGIC: Alert and oriented, answers questions appropriately, normal gait and station.  PSYCHIATRIC: Normal mood, affect , and behavior.    Assessment & Plan  GERD (GASTROESOPHAGEAL REFLUX DISEASE) (530.81  K21.9) Current Plans  Laparoscopic Nissen fundoplication. I have explained the procedure, risks, and aftercare. Risks include but are not limited to bleeding, infection, wound problems, anesthesia, dysphagia, injury to esophagus/stomach/liver/spleen/intestine, need for recurrent surgery, gas bloat, diarrhea, failure to resolve symptoms.  Avel Peaceodd Malique Driskill

## 2013-12-27 NOTE — Transfer of Care (Signed)
Immediate Anesthesia Transfer of Care Note  Patient: Michele Erickson  Procedure(s) Performed: Procedure(s) (LRB): LAPAROSCOPIC NISSEN FUNDOPLICATION, laparoscopic hiatal hernia repair (N/A)  Patient Location: PACU  Anesthesia Type: General  Level of Consciousness: sedated, patient cooperative and responds to stimulation  Airway & Oxygen Therapy: Patient Spontanous Breathing and Patient connected to face mask oxgen  Post-op Assessment: Report given to PACU RN and Post -op Vital signs reviewed and stable  Post vital signs: Reviewed and stable  Complications: No apparent anesthesia complications

## 2013-12-27 NOTE — Plan of Care (Signed)
Problem: Phase I Progression Outcomes Goal: Pain controlled with appropriate interventions Outcome: Completed/Met Date Met:  12/27/13 Goal: Incision/dressings dry and intact Outcome: Completed/Met Date Met:  12/27/13 Goal: Initial discharge plan identified Outcome: Completed/Met Date Met:  12/27/13 Goal: Voiding-avoid urinary catheter unless indicated Outcome: Completed/Met Date Met:  12/27/13

## 2013-12-27 NOTE — Anesthesia Preprocedure Evaluation (Signed)
Anesthesia Evaluation  Patient identified by MRN, date of birth, ID band Patient awake    Reviewed: Allergy & Precautions, H&P , NPO status , Patient's Chart, lab work & pertinent test results  Airway Mallampati: II  TM Distance: >3 FB Neck ROM: Full    Dental no notable dental hx.    Pulmonary sleep apnea ,  breath sounds clear to auscultation  Pulmonary exam normal       Cardiovascular negative cardio ROS  Rhythm:Regular Rate:Normal     Neuro/Psych negative neurological ROS  negative psych ROS   GI/Hepatic Neg liver ROS, GERD-  Medicated and Poorly Controlled,  Endo/Other  negative endocrine ROS  Renal/GU negative Renal ROS  negative genitourinary   Musculoskeletal negative musculoskeletal ROS (+)   Abdominal   Peds negative pediatric ROS (+)  Hematology negative hematology ROS (+)   Anesthesia Other Findings   Reproductive/Obstetrics negative OB ROS                             Anesthesia Physical Anesthesia Plan  ASA: II  Anesthesia Plan: General   Post-op Pain Management:    Induction: Intravenous and Rapid sequence  Airway Management Planned: Oral ETT  Additional Equipment:   Intra-op Plan:   Post-operative Plan: Extubation in OR  Informed Consent: I have reviewed the patients History and Physical, chart, labs and discussed the procedure including the risks, benefits and alternatives for the proposed anesthesia with the patient or authorized representative who has indicated his/her understanding and acceptance.   Dental advisory given  Plan Discussed with: CRNA and Surgeon  Anesthesia Plan Comments:         Anesthesia Quick Evaluation

## 2013-12-27 NOTE — Discharge Instructions (Addendum)
[facility]  No lifting over 10 pounds or strenuous activity for 6 weeks.  May shower.  Remove bandages Sunday.  May drive when pain-free.  Walk!  Do not overeat!  Level 1 diet.  Appointment in 3 weeks.  Please call [phone] to make appointment.  Call for high fever (>101.5), vomiting, wound problems.   EATING AFTER YOUR ESOPHAGEAL SURGERY  After your esophageal surgery, you can expect some difficulty swallowing.  If food sticks when you eat, it is called "dysphagia".  This is due to swelling around your surgery site and will most likely resolve within a few weeks.  To help you through this temporary phase, we start you out on a pureed diet.  Your first meal in the hospital was clear liquids.  You should have been given a pureed diet by the time you left the hospital.  We ask patients to stay on a pureed diet for the first two weeks to avoid anything getting "stuck" near your recent surgery.  Don't be alarmed if your ability to swallow doesn't progress according to this plan.  Everyone is different and some take longer or shorter.  Use common sense.  If you are having trouble swallowing a particular food, then avoid it.  If food is sticking when you advance your diet, go back to the previous day or two.  In general some simple rules to follow are:  Maintain an upright position (as near 90 degrees as possible) whenever eating or drinking.  Take small bites - only 1/2 to 1 teaspoon at a time.  Eat slowly.  It may also help to eat only one food at a time.  Avoid talking while eating.  Do not mix solid foods and liquids in the same mouthful and do not "wash foods down" with liquids, unless you have been instructed to do so by your surgeon.  Eat in a relaxed atmosphere, with no distractions.  Following each meal, sit in an upright position (90 degree angle) for at least 60 minutes.  Avoid carbonated (bubbly) drinks.  Do not use straws.  If food does stick,  don't panic.  Try to relax and let the food pass on its own.  Sipping strong hot black tea can also help.  If you have any questions please call our office at [phone].   LEVEL 1 PUREED FOODS:  1ST 2 WEEKS AFTER SURGERY Foods in this group are pureed or blenderized to a smooth, mashed potato-like consistency.  If necessary, the pureed foods can keep their shape with the addition of a thickening agent.  Meat should be pureed to a smooth pasty consistency.  Hot broth or gravy may be added to the pureed meat, approximately 1 oz. of liquid per 3 oz. serving of meat. CAUTION:  If any foods do not puree into a smooth consistency, it may make eating for swallowing more difficult.  For example, zucchini seeds sometimes do not blend well. Hot Foods Cold Foods  Pureed scrambled eggs and cheese Pureed cottage cheese  Baby cereals Thickened juices and nectars  Thinned cooked cereals (no lumps) Thickened milk or eggnog  Pureed [place] toast or pancakes Ensure  Mashed potatoes Ice cream  Pureed parsley, au gratin, scalloped potatoes, candied sweet potatoes Fruit or [place] ice, sherbet  Pureed buttered or alfredo noodles Plain yogurt  Pureed vegetables (no corn or peas) Instant breakfast  Pureed soups and creamed soups Smooth pudding, mousse, custard  Pureed scalloped apples Whipped gelatin  Gravies Sugar, syrup, honey,  jelly  Sauces, cheese, tomato, barbecue, white, creamed Cream  Any baby food Creamer  Alcohol in moderation (not beer or champagne) Margarine  Coffee or tea Mayonnaise   Ketchup, mustard   Apple sauce   SAMPLE MENU:  PUREED DIET Breakfast Lunch Dinner   Orange juice, 1/2 cup  Cream of wheat, 1/2 cup  Pineapple juice, 1/2 cup  Pureed [place], barley soup, 3/4 cup  Pureed Hawaiian chicken, 3 oz   Scrambled eggs, mashed or blended with cheese, 1/2 cup  Tea or coffee, 1 cup   Whole milk, 1 cup   Non-dairy creamer, 2 Tbsp.  Mashed potatoes, 1/2 cup  Pureed cooled  broccoli, 1/2 cup  Apple sauce, 1/2 cup  Coffee or tea  Mashed potatoes, 1/2 cup  Pureed spinach, 1/2 cup  Frozen yogurt, 1/2 cup  Tea or coffee    LEVEL 2 After your first 2 weeks, you can advance to a soft diet.  Keep on this diet until everything goes down easily. Hot Foods Cold Foods  White fish Cottage cheese  Stuffed fish Junior baby fruit  Baby food meals Semi thickened juices  Minced soft cooked, scrambled, poached eggs nectars  Souffle & omelets Ripe mashed bananas  Cooked cereals Canned fruit, pineapple sauce, milk  potatoes Milkshake  Buttered or Alfredo noodles Custard  Cooked cooled vegetable Puddings, including tapioca  Sherbet Yogurt  Vegetable soup or alphabet soup Fruit ice, [place] ice  Gravies Whipped gelatin  Sugar, syrup, honey, jelly Junior baby desserts  Sauces:  Cheese, creamed, barbecue, tomato, white Cream  Coffee or tea Margarine   SAMPLE MENU:  LEVEL 2 Breakfast Lunch Dinner   Orange juice, 1/2 cup  Oatmeal, 1/2 cup  Scrambled eggs with cheese, 1/2 cup  Decaffeinated tea, 1 cup  Whole milk, 1 cup  Non-dairy creamer, 2 Tbsp  Pineapple juice, 1/2 cup  Minced beef, 3 oz  Gravy, 2 Tbsp  Mashed potatoes, 1/2 cup  Minced fresh broccoli, 1/2 cup  Applesauce, 1/2 cup  Coffee, 1 cup  [place], barley soup, 3/4 cup  Minced Hawaiian chicken, 3 oz  Mashed potatoes, 1/2 cup  Cooked spinach, 1/2 cup  Frozen yogurt, 1/2 cup  Non-dairy creamer, 2 Tbsp    LEVEL 3 After all the foods in level 2 (soft diet) are passing through well you should advance up to the next level.  It is still important to cut these foods into small pieces and eat slowly. Hot Foods Cold Foods  Poultry Cottage cheese  Chopped Swedish meatballs Yogurt  Meat salads (ground or flaked meat) Milk  Flaked fish (tuna) Milkshakes  Poached or scrambled eggs Soft, cold, dry cereal  Souffles and omelets Fruit juices or nectars  Cooked cereals Chopped canned fruit    Chopped [place] toast or pancakes Canned fruit cocktail  Noodles or pasta (no rice) Pudding, mousse, custard  Cooked vegetables (no frozen peas, corn, or mixed vegetables) Green salad  Canned small sweet peas Ice cream  Creamed soup or vegetable soup Fruit ice, [place] ice  Pureed vegetable soup or alphabet soup Non-dairy creamer  Ground scalloped apples Margarine  Gravies Mayonnaise  Sauces:  Cheese, creamed, barbecue, tomato, white Ketchup  Coffee or tea Mustard   SAMPLE MENU:  LEVEL 3 Breakfast Lunch Dinner   Orange juice, 1/2 cup  Oatmeal, 1/2 cup  Scrambled eggs with cheese, 1/2 cup  Decaffeinated tea, 1 cup  Whole milk, 1 cup  Non-dairy creamer, 2 Tbsp  Ketchup, 1 Tbsp  Margarine, 1 tsp  Salt, 1/4 tsp  Sugar, 2 tsp  Pineapple juice, 1/2 cup  Ground beef, 3 oz  Gravy, 2 Tbsp  Mashed potatoes, 1/2 cup  Cooked spinach, 1/2 cup  Applesauce, 1/2 cup  Decaffeinated coffee  Whole milk  Non-dairy creamer, 2 Tbsp  Margarine, 1 tsp  Salt, 1/4 tsp  Pureed [place], barley soup, 3/4 cup  Barbecue chicken, 3 oz  Mashed potatoes, 1/2 cup  Ground fresh broccoli, 1/2 cup  Frozen yogurt, 1/2 cup  Decaffeinated tea, 1 cup  Non-dairy creamer, 2 Tbsp  Margarine, 1 tsp  Salt, 1/4 tsp  Sugar, 1 tsp    LEVEL 4:  REGULAR FOODS Foods in this group are soft, moist, regularly textured foods.  This level includes red meat and breads, which tend to be the hardest things to swallow.  Eat very slow, chew well and continue to avoid carbonated drinks. Hot Foods Cold Foods  Baked fish or skinned Soft cheeses - cottage cheese  Souffles and omelets Cream cheese  Eggs Yogurt  Stuffed shells Milk  Spaghetti with meat sauce Milkshakes  Cooked cereal Cold dry cereals (no nuts, dried fruit, coconut)  [place] toast or pancakes Crackers  Buttered toast Fruit juices or nectars  Noodles or pasta (no rice) Canned fruit  Potatoes (all types) Ripe bananas  Soft, cooked  vegetables (no corn, lima, or baked beans) Peeled, ripe, fresh fruit  Creamed soups or vegetable soup Cakes (no nuts, dried fruit, coconut)  Canned chicken noodle soup Plain doughnuts  Gravies Ice cream  Bacon dressing Pudding, mousse, custard  Sauces:  Cheese, creamed, barbecue, tomato, white Fruit ice, [place] ice, sherbet  Decaffeinated tea or coffee Whipped gelatin  Pork chops Regular gelatin   Canned fruited gelatin molds   Sugar, syrup, honey, jam, jelly   Cream   Non-dairy   Margarine   Oil   Mayonnaise   Ketchup   Mustard

## 2013-12-27 NOTE — Anesthesia Postprocedure Evaluation (Signed)
  Anesthesia Post-op Note  Patient: Michele Erickson  Procedure(s) Performed: Procedure(s) (LRB): LAPAROSCOPIC NISSEN FUNDOPLICATION, laparoscopic hiatal hernia repair (N/A)  Patient Location: PACU  Anesthesia Type: General  Level of Consciousness: awake and alert   Airway and Oxygen Therapy: Patient Spontanous Breathing  Post-op Pain: mild  Post-op Assessment: Post-op Vital signs reviewed, Patient's Cardiovascular Status Stable, Respiratory Function Stable, Patent Airway and No signs of Nausea or vomiting  Last Vitals:  Filed Vitals:   12/27/13 1145  BP: 122/49  Pulse: 73  Temp: 36.6 C  Resp: 12    Post-op Vital Signs: stable   Complications: No apparent anesthesia complications

## 2013-12-27 NOTE — Interval H&P Note (Signed)
History and Physical Interval Note:  12/27/2013 9:06 AM  Michele Erickson  has presented today for surgery, with the diagnosis of GASTROESOPHAGEAL REFLUX DISEASE  The various methods of treatment have been discussed with the patient and family. After consideration of risks, benefits and other options for treatment, the patient has consented to  Procedure(s): LAPAROSCOPIC NISSEN FUNDOPLICATION (N/A) as a surgical intervention .  The patient's history has been reviewed, patient examined, no change in status, stable for surgery.  I have reviewed the patient's chart and labs.  Questions were answered to the patient's satisfaction.     [name] JShela Commons

## 2013-12-28 ENCOUNTER — Encounter (HOSPITAL_COMMUNITY): Payer: Self-pay | Admitting: General Surgery

## 2013-12-28 DIAGNOSIS — K219 Gastro-esophageal reflux disease without esophagitis: Secondary | ICD-10-CM | POA: Diagnosis not present

## 2013-12-28 MED ORDER — ONDANSETRON HCL 4 MG PO TABS: 4.0000 mg | ORAL_TABLET | ORAL | Status: DC | PRN

## 2013-12-28 MED ORDER — OXYCODONE HCL 5 MG PO TABS: 5.0000 mg | ORAL_TABLET | ORAL | Status: DC | PRN

## 2013-12-28 NOTE — Progress Notes (Signed)
UR completed 

## 2013-12-28 NOTE — Plan of Care (Signed)
Problem: Phase II Progression Outcomes Goal: Progress activity as tolerated unless otherwise ordered Outcome: Completed/Met Date Met:  12/28/13     

## 2013-12-28 NOTE — Progress Notes (Signed)
1 Day Post-Op  Subjective: She feels good this morning.  She has been walking.  The soreness is improved.  No nausea.  Tolerating clear liquids.  Objective: Vital signs in last 24 hours: Temp:  [97.6 F (36.4 C)-98.7 F (37.1 C)] 98.3 F (36.8 C) (11/20 0606) Pulse Rate:  [65-113] 95 (11/20 0606) Resp:  [9-20] 14 (11/20 0606) BP: (111-140)/(46-81) 120/73 mmHg (11/20 0606) SpO2:  [97 %-100 %] 98 % (11/20 0606) Weight:  [153 lb (69.4 kg)] 153 lb (69.4 kg) (11/19 0738) Last BM Date:  (PTA)  Intake/Output from previous day: 11/19 0701 - 11/20 0700 In: 4150.8 [P.O.:180; I.V.:3970.8] Out: 1535 [Urine:1515; Blood:20] Intake/Output this shift:    PE: General- In NAD Abdomen-soft, dressings dry  Lab Results:  No results for input(s): WBC, HGB, HCT, PLT in the last 72 hours. BMET No results for input(s): NA, K, CL, CO2, GLUCOSE, BUN, CREATININE, CALCIUM in the last 72 hours. PT/INR No results for input(s): LABPROT, INR in the last 72 hours. Comprehensive Metabolic Panel:    Component Value Date/Time   NA 141 12/20/2013 1330   NA 139 12/27/2006 1453   K 4.2 12/20/2013 1330   K 4.8 12/27/2006 1453   CL 103 12/20/2013 1330   CL 103 12/27/2006 1453   CO2 26 12/20/2013 1330   CO2 28 12/27/2006 1453   BUN 14 12/20/2013 1330   BUN 14 12/27/2006 1453   CREATININE 0.85 12/20/2013 1330   CREATININE 0.76 12/27/2006 1453   GLUCOSE 85 12/20/2013 1330   GLUCOSE 86 12/27/2006 1453   CALCIUM 9.8 12/20/2013 1330   CALCIUM 10.2 12/27/2006 1453   AST 14 12/20/2013 1330   ALT 13 12/20/2013 1330   ALKPHOS 62 12/20/2013 1330   BILITOT 0.4 12/20/2013 1330   PROT 8.2 12/20/2013 1330   ALBUMIN 4.5 12/20/2013 1330     Studies/Results: No results found.  Anti-infectives: Anti-infectives    Start     Dose/Rate Route Frequency Ordered Stop   12/27/13 0714  vancomycin (VANCOCIN) IVPB 1000 mg/200 mL premix     1,000 mg200 mL/hr over 60 Minutes Intravenous On call to O.R. 12/27/13 0714  12/27/13 0950      Assessment Principal Problem:   GERD (gastroesophageal reflux disease) s/p lap hiatal hernia repair and Nissen fundoplication-doing well.    LOS: 1 day   Plan: Advance to full liquid diet.  If tolerated, may go home today.   Envy Meno J 12/28/2013

## 2013-12-28 NOTE — Progress Notes (Signed)
12/28/13 1520  Reviewed discharge with patient. Discharge papers and prescriptions given to patient. Pt verbalized understanding of discharge.

## 2013-12-28 NOTE — Plan of Care (Signed)
Problem: Phase II Progression Outcomes Goal: Pain controlled Outcome: Completed/Met Date Met:  12/28/13

## 2013-12-28 NOTE — Plan of Care (Signed)
Problem: Phase I Progression Outcomes Goal: OOB as tolerated unless otherwise ordered Outcome: Completed/Met Date Met:  12/28/13     

## 2014-01-07 NOTE — Discharge Summary (Signed)
Physician Discharge Summary  Patient ID: Michele Erickson MRN: [name] DOB/AGE: 45/03/1970 45 y.o.  Admit date: 12/27/2013 Discharge date: 12/28/2013  Admission Diagnoses:  GERD Discharge Diagnoses:  Principal Problem:   GERD (gastroesophageal reflux disease) and small hiatal hernia s/p lap hiatal hernia repair and Nissen fundoplication   Discharged Condition: good  Hospital Course: She underwent the above procedure and did well.  She tolerated a liquid diet on POD #1 and was able to be discharged.  Discharge instructions were given.  Consults: None  Significant Diagnostic Studies: none  Discharge Exam: Blood pressure 127/64, pulse 85, temperature 98.7 F (37.1 C), temperature source Oral, resp. rate 16, height 5\' 3"  (1.6 m), weight 153 lb (69.4 kg), last menstrual period 12/24/2013, SpO2 98 %.   Disposition: 01-Home or Self Care     Medication List    STOP taking these medications        calcium carbonate 500 MG chewable tablet  Commonly known as:  TUMS - dosed in mg elemental calcium     famotidine 20 MG tablet  Commonly known as:  PEPCID      TAKE these medications        acidophilus Caps capsule  Take 1 capsule by mouth daily.     multivitamin with minerals Tabs tablet  Take 1 tablet by mouth daily.     nitrofurantoin 50 MG capsule  Commonly known as:  MACRODANTIN  Take 50 mg by mouth daily as needed (after sexual relations).     ondansetron 4 MG tablet  Commonly known as:  ZOFRAN  Take 1 tablet (4 mg total) by mouth every 4 (four) hours as needed for nausea or vomiting.     oxyCODONE 5 MG immediate release tablet  Commonly known as:  Oxy IR/ROXICODONE  Take 1-2 tablets (5-10 mg total) by mouth every 4 (four) hours as needed for moderate pain.         Signed: Adolph PollackOSENBOWER,Shaunice Levitan J 01/07/2014, 11:00 AM

## 2014-12-19 ENCOUNTER — Other Ambulatory Visit: Payer: Self-pay

## 2014-12-19 DIAGNOSIS — I83812 Varicose veins of left lower extremities with pain: Secondary | ICD-10-CM

## 2015-02-04 ENCOUNTER — Encounter: Payer: Self-pay | Admitting: Vascular Surgery

## 2015-02-11 ENCOUNTER — Encounter: Payer: Self-pay | Admitting: Vascular Surgery

## 2015-02-11 ENCOUNTER — Ambulatory Visit (HOSPITAL_COMMUNITY)
Admission: RE | Admit: 2015-02-11 | Discharge: 2015-02-11 | Disposition: A | Discharge status: Home / Self Care | Payer: BLUE CROSS/BLUE SHIELD | Source: Ambulatory Visit | Attending: Vascular Surgery | Admitting: Vascular Surgery

## 2015-02-11 ENCOUNTER — Ambulatory Visit (INDEPENDENT_AMBULATORY_CARE_PROVIDER_SITE_OTHER): Payer: BLUE CROSS/BLUE SHIELD | Admitting: Vascular Surgery

## 2015-02-11 VITALS — BP 113/75 | HR 76 | Temp 98.2°F | Resp 18 | Ht 63.0 in | Wt 145.8 lb

## 2015-02-11 DIAGNOSIS — I83812 Varicose veins of left lower extremities with pain: Secondary | ICD-10-CM | POA: Diagnosis not present

## 2015-02-11 DIAGNOSIS — I83892 Varicose veins of left lower extremities with other complications: Secondary | ICD-10-CM | POA: Diagnosis not present

## 2015-02-11 NOTE — Progress Notes (Signed)
Vascular and [facility]in Specialist of [facility]  Patient name: Michele Erickson MRN: [id number] DOB: Feb 08, 1971 Sex: female  REASON FOR VISIT:  Valuation of the varicosity and left popliteal space.  HPI: Michele Erickson is a 45 y.o. female  Seen for evaluation of her left leg venous varicosity. She has a long history of this. She reports that this is most particularly discomforting with prolonged standing. This is been present for a number of years. She does not recall that this was worse with pregnancies. Her youngest child is 66. She has no history of DVT.  Past Medical History  Diagnosis Date  . Anxiety   . Family history of adverse reaction to anesthesia     mother nausea and vomiting   . Sleep apnea     no cpap   . GERD (gastroesophageal reflux disease)   . History of hiatal hernia   . Varicose veins     Family History  Problem Relation Age of Onset  . Heart disease Father     SOCIAL HISTORY: Social History  Substance Use Topics  . Smoking status: Never Smoker   . Smokeless tobacco: Never Used  . Alcohol Use: No    Allergies  Allergen Reactions  . Cephalexin Other (See Comments)    Made veins light up, and she got really hot.  . Demerol Other (See Comments)    Makes patient very hot.    Current Outpatient Prescriptions  Medication Sig Dispense Refill  . acidophilus (RISAQUAD) CAPS capsule Take 1 capsule by mouth daily.    . Multiple Vitamin (MULITIVITAMIN WITH MINERALS) TABS Take 1 tablet by mouth daily.     . nitrofurantoin (MACRODANTIN) 50 MG capsule Take 50 mg by mouth daily as needed (after sexual relations).    . ondansetron (ZOFRAN) 4 MG tablet Take 1 tablet (4 mg total) by mouth every 4 (four) hours as needed for nausea or vomiting. (Patient not taking: Reported on 02/11/2015) 30 tablet 0  . oxyCODONE (OXY IR/ROXICODONE) 5 MG immediate release tablet Take 1-2 tablets (5-10 mg total) by mouth every 4 (four) hours as needed for moderate pain. (Patient not taking:  Reported on 02/11/2015) 40 tablet 0   No current facility-administered medications for this visit.    REVIEW OF SYSTEMS:  [X]  denotes positive finding, [ ]  denotes negative finding Cardiac  Comments:  Chest pain or chest pressure:    Shortness of breath upon exertion:    Short of breath when lying flat:    Irregular heart rhythm:        Vascular    Pain in calf, thigh, or hip brought on by ambulation:    Pain in feet at night that wakes you up from your sleep:     Blood clot in your veins:    Leg swelling:         Pulmonary    Oxygen at home:    Productive cough:     Wheezing:         Neurologic    Sudden weakness in arms or legs:     Sudden numbness in arms or legs:     Sudden onset of difficulty speaking or slurred speech:    Temporary loss of vision in one eye:     Problems with dizziness:         Gastrointestinal    Blood in stool:     Vomited blood:         Genitourinary    Burning when  urinating:     Blood in urine:        Psychiatric    Major depression:         Hematologic    Bleeding problems:    Problems with blood clotting too easily:        Skin    Rashes or ulcers:        Constitutional    Fever or chills:      PHYSICAL EXAM: Filed Vitals:   02/11/15 1423  BP: 113/75  Pulse: 76  Temp: 98.2 F (36.8 C)  TempSrc: Oral  Resp: 18  Height: 5\' 3"  (1.6 m)  Weight: 145 lb 12.8 oz (66.134 kg)  SpO2: 100%    GENERAL: The patient is a well-nourished female, in no acute distress. The vital signs are documented above. VASCULAR:  2+ dorsalis pedis pulses bilaterally PULMONARY: There is good air exchange.  MUSCULOSKELETAL: There are no major deformities or cyanosis. NEUROLOGIC: No focal weakness or paresthesias are detected. SKIN: There are no ulcers or rashes noted. PSYCHIATRIC: The patient has a normal affect.  she does have the reticular vein versus a varix in her left leg behind the knee extending into the medial thigh.  DATA:   Venous  duplex shows no significant reflux in her deep or superficial system. This vein does arise from her saphenous vein and mid thigh.   I reimage this area with SonoSite ultrasound. This does not communicate with her small saphenous vein at the popliteal space.  MEDICAL ISSUES:  painful reticular veins over the posterior aspect of her left leg. Explained that this does not pose any risk for increased risk for DVT. Explained this could be treated with sclerotherapy to occlude this reticular vein and therefore decrease the pressure that she is feeling with prolonged standing. Explain the slight risk of DVT with the procedure. She wishes to proceed with this. She does have a great deal of anxiety without regarding treatment with meals. I explained that this with the require no local metastatic since this would be more painful with local anesthesia than simply injecting sclerosing into the vein. She understands and wished to proceed in the near future  No Follow-up on file.   Gretta BeganEarly, Tilia Faso [facility] [place]: [phone]

## 2015-02-12 ENCOUNTER — Encounter: Payer: Self-pay | Admitting: Vascular Surgery

## 2015-02-13 ENCOUNTER — Encounter: Payer: Self-pay | Admitting: Vascular Surgery

## 2015-02-13 ENCOUNTER — Ambulatory Visit (INDEPENDENT_AMBULATORY_CARE_PROVIDER_SITE_OTHER): Payer: BLUE CROSS/BLUE SHIELD | Admitting: Vascular Surgery

## 2015-02-13 VITALS — BP 105/51 | HR 84 | Temp 98.5°F | Resp 14 | Ht 63.0 in | Wt 145.0 lb

## 2015-02-13 DIAGNOSIS — I83892 Varicose veins of left lower extremities with other complications: Secondary | ICD-10-CM

## 2015-02-13 DIAGNOSIS — I83899 Varicose veins of unspecified lower extremities with other complications: Secondary | ICD-10-CM | POA: Insufficient documentation

## 2015-02-13 NOTE — Progress Notes (Signed)
Patient underwent uneventful sclerotherapy reticular varicosities in the posterior aspect of her left popliteal space. Was a 0.3 percent Sotradecol administered with a 30-gauge needle. Compression garments were applied. She did well with this will be seen again in one week for further follow-up

## 2015-02-14 ENCOUNTER — Encounter: Payer: Self-pay | Admitting: Vascular Surgery

## 2015-02-20 ENCOUNTER — Ambulatory Visit (INDEPENDENT_AMBULATORY_CARE_PROVIDER_SITE_OTHER): Payer: BLUE CROSS/BLUE SHIELD | Admitting: Vascular Surgery

## 2015-02-20 ENCOUNTER — Encounter: Payer: Self-pay | Admitting: Vascular Surgery

## 2015-02-20 VITALS — BP 114/60 | HR 87 | Temp 98.1°F | Resp 20 | Ht 62.0 in | Wt 145.4 lb

## 2015-02-20 DIAGNOSIS — I83892 Varicose veins of left lower extremities with other complications: Secondary | ICD-10-CM

## 2015-02-20 NOTE — Progress Notes (Signed)
Patient is today 1 week after sclerotherapy of painful reticular veins in her posterior left popliteal popliteal space. She did well with the procedure and has been compliant with her compression garments. On physical exam she has had a closure of this reticular. There is no evidence of erythema. She has no bruising. She does have some thickening over the thrombosed reticular vein.  He does not have any evidence of blood trapped under the skin that would require expression. I did explain that this will take another 1-2 weeks for the soreness to resolve and would expect complete correction of the discomfort that she was having with distention of this reticular veins in her popliteal space. She will wear her compression garment for one additional week and then on as-needed basis

## 2015-02-24 ENCOUNTER — Encounter: Payer: Self-pay | Admitting: Obstetrics and Gynecology

## 2015-02-26 ENCOUNTER — Telehealth: Payer: Self-pay | Admitting: *Deleted

## 2015-02-26 NOTE — Telephone Encounter (Signed)
Michele Erickson called complaining of a small raised area  (vein) on her left  lower shin.  I instructed her to elevate her leg and apply ice to see if this area improves.  She first observed this today.  Also she was complaining of soreness behind her left knee. I explained that soreness at this point would not be unusual, however if this should worsen call early tomorrow morning while Dr Arbie Cookey is in the office. Michele Erickson voiced understanding of these instructions.

## 2015-04-29 ENCOUNTER — Other Ambulatory Visit: Payer: Self-pay | Admitting: Family Medicine

## 2015-04-29 DIAGNOSIS — R102 Pelvic and perineal pain: Secondary | ICD-10-CM

## 2015-05-05 ENCOUNTER — Ambulatory Visit
Admission: RE | Admit: 2015-05-05 | Discharge: 2015-05-05 | Disposition: A | Discharge status: Home / Self Care | Payer: BLUE CROSS/BLUE SHIELD | Source: Ambulatory Visit | Attending: Family Medicine | Admitting: Family Medicine

## 2015-05-05 DIAGNOSIS — R102 Pelvic and perineal pain: Secondary | ICD-10-CM

## 2015-05-16 DIAGNOSIS — S238XXA Sprain of other specified parts of thorax, initial encounter: Secondary | ICD-10-CM | POA: Diagnosis not present

## 2015-06-24 DIAGNOSIS — Z6825 Body mass index (BMI) 25.0-25.9, adult: Secondary | ICD-10-CM | POA: Diagnosis not present

## 2015-06-24 DIAGNOSIS — Z1231 Encounter for screening mammogram for malignant neoplasm of breast: Secondary | ICD-10-CM | POA: Diagnosis not present

## 2015-06-24 DIAGNOSIS — Z01419 Encounter for gynecological examination (general) (routine) without abnormal findings: Secondary | ICD-10-CM | POA: Diagnosis not present

## 2015-06-27 DIAGNOSIS — L821 Other seborrheic keratosis: Secondary | ICD-10-CM | POA: Diagnosis not present

## 2015-06-27 DIAGNOSIS — L918 Other hypertrophic disorders of the skin: Secondary | ICD-10-CM | POA: Diagnosis not present

## 2015-06-27 DIAGNOSIS — Z13820 Encounter for screening for osteoporosis: Secondary | ICD-10-CM | POA: Diagnosis not present

## 2015-06-30 ENCOUNTER — Other Ambulatory Visit: Payer: Self-pay | Admitting: Family Medicine

## 2015-06-30 DIAGNOSIS — N83209 Unspecified ovarian cyst, unspecified side: Secondary | ICD-10-CM

## 2015-07-04 ENCOUNTER — Ambulatory Visit
Admission: RE | Admit: 2015-07-04 | Discharge: 2015-07-04 | Disposition: A | Discharge status: Home / Self Care | Payer: BLUE CROSS/BLUE SHIELD | Source: Ambulatory Visit | Attending: Family Medicine | Admitting: Family Medicine

## 2015-07-04 DIAGNOSIS — N83209 Unspecified ovarian cyst, unspecified side: Secondary | ICD-10-CM

## 2015-07-04 DIAGNOSIS — N83299 Other ovarian cyst, unspecified side: Secondary | ICD-10-CM | POA: Diagnosis not present

## 2015-08-15 ENCOUNTER — Other Ambulatory Visit: Payer: Self-pay | Admitting: Family Medicine

## 2015-08-15 DIAGNOSIS — N83209 Unspecified ovarian cyst, unspecified side: Secondary | ICD-10-CM

## 2015-08-29 ENCOUNTER — Ambulatory Visit
Admission: RE | Admit: 2015-08-29 | Discharge: 2015-08-29 | Disposition: A | Discharge status: Home / Self Care | Payer: BLUE CROSS/BLUE SHIELD | Source: Ambulatory Visit | Attending: Family Medicine | Admitting: Family Medicine

## 2015-08-29 DIAGNOSIS — N83209 Unspecified ovarian cyst, unspecified side: Secondary | ICD-10-CM

## 2015-08-29 DIAGNOSIS — N83202 Unspecified ovarian cyst, left side: Secondary | ICD-10-CM | POA: Diagnosis not present

## 2015-09-22 DIAGNOSIS — D2371 Other benign neoplasm of skin of right lower limb, including hip: Secondary | ICD-10-CM | POA: Diagnosis not present

## 2015-09-22 DIAGNOSIS — D225 Melanocytic nevi of trunk: Secondary | ICD-10-CM | POA: Diagnosis not present

## 2015-09-22 DIAGNOSIS — D485 Neoplasm of uncertain behavior of skin: Secondary | ICD-10-CM | POA: Diagnosis not present

## 2015-09-22 DIAGNOSIS — L821 Other seborrheic keratosis: Secondary | ICD-10-CM | POA: Diagnosis not present

## 2015-09-22 DIAGNOSIS — D1801 Hemangioma of skin and subcutaneous tissue: Secondary | ICD-10-CM | POA: Diagnosis not present

## 2016-01-05 DIAGNOSIS — R319 Hematuria, unspecified: Secondary | ICD-10-CM | POA: Diagnosis not present

## 2016-01-05 DIAGNOSIS — N390 Urinary tract infection, site not specified: Secondary | ICD-10-CM | POA: Diagnosis not present

## 2016-01-05 DIAGNOSIS — Z23 Encounter for immunization: Secondary | ICD-10-CM | POA: Diagnosis not present

## 2016-06-14 DIAGNOSIS — F411 Generalized anxiety disorder: Secondary | ICD-10-CM | POA: Diagnosis not present

## 2016-06-29 ENCOUNTER — Other Ambulatory Visit: Payer: Self-pay | Admitting: Obstetrics and Gynecology

## 2016-06-29 DIAGNOSIS — N644 Mastodynia: Secondary | ICD-10-CM | POA: Diagnosis not present

## 2016-07-01 ENCOUNTER — Ambulatory Visit
Admission: RE | Admit: 2016-07-01 | Discharge: 2016-07-01 | Disposition: A | Discharge status: Home / Self Care | Payer: BLUE CROSS/BLUE SHIELD | Source: Ambulatory Visit | Attending: Obstetrics and Gynecology | Admitting: Obstetrics and Gynecology

## 2016-07-01 DIAGNOSIS — N644 Mastodynia: Secondary | ICD-10-CM

## 2016-07-01 DIAGNOSIS — R928 Other abnormal and inconclusive findings on diagnostic imaging of breast: Secondary | ICD-10-CM | POA: Diagnosis not present

## 2016-07-02 DIAGNOSIS — F411 Generalized anxiety disorder: Secondary | ICD-10-CM | POA: Diagnosis not present

## 2016-09-28 DIAGNOSIS — Z6826 Body mass index (BMI) 26.0-26.9, adult: Secondary | ICD-10-CM | POA: Diagnosis not present

## 2016-09-28 DIAGNOSIS — Z01419 Encounter for gynecological examination (general) (routine) without abnormal findings: Secondary | ICD-10-CM | POA: Diagnosis not present

## 2016-10-01 DIAGNOSIS — D3131 Benign neoplasm of right choroid: Secondary | ICD-10-CM | POA: Diagnosis not present

## 2016-12-20 DIAGNOSIS — N390 Urinary tract infection, site not specified: Secondary | ICD-10-CM | POA: Diagnosis not present

## 2017-08-17 DIAGNOSIS — M545 Low back pain: Secondary | ICD-10-CM | POA: Diagnosis not present

## 2017-08-18 DIAGNOSIS — M545 Low back pain: Secondary | ICD-10-CM | POA: Diagnosis not present

## 2017-08-22 DIAGNOSIS — M545 Low back pain: Secondary | ICD-10-CM | POA: Diagnosis not present

## 2017-09-08 DIAGNOSIS — M6281 Muscle weakness (generalized): Secondary | ICD-10-CM | POA: Diagnosis not present

## 2017-09-08 DIAGNOSIS — M5116 Intervertebral disc disorders with radiculopathy, lumbar region: Secondary | ICD-10-CM | POA: Diagnosis not present

## 2017-09-08 DIAGNOSIS — M5126 Other intervertebral disc displacement, lumbar region: Secondary | ICD-10-CM | POA: Diagnosis not present

## 2017-09-08 DIAGNOSIS — M545 Low back pain: Secondary | ICD-10-CM | POA: Diagnosis not present

## 2017-09-15 DIAGNOSIS — M5126 Other intervertebral disc displacement, lumbar region: Secondary | ICD-10-CM | POA: Diagnosis not present

## 2017-09-15 DIAGNOSIS — M5116 Intervertebral disc disorders with radiculopathy, lumbar region: Secondary | ICD-10-CM | POA: Diagnosis not present

## 2017-09-15 DIAGNOSIS — M545 Low back pain: Secondary | ICD-10-CM | POA: Diagnosis not present

## 2017-09-15 DIAGNOSIS — M6281 Muscle weakness (generalized): Secondary | ICD-10-CM | POA: Diagnosis not present

## 2017-09-23 DIAGNOSIS — M6281 Muscle weakness (generalized): Secondary | ICD-10-CM | POA: Diagnosis not present

## 2017-09-23 DIAGNOSIS — M5126 Other intervertebral disc displacement, lumbar region: Secondary | ICD-10-CM | POA: Diagnosis not present

## 2017-09-23 DIAGNOSIS — M545 Low back pain: Secondary | ICD-10-CM | POA: Diagnosis not present

## 2017-09-23 DIAGNOSIS — M5116 Intervertebral disc disorders with radiculopathy, lumbar region: Secondary | ICD-10-CM | POA: Diagnosis not present

## 2017-10-03 DIAGNOSIS — M5126 Other intervertebral disc displacement, lumbar region: Secondary | ICD-10-CM | POA: Diagnosis not present

## 2017-10-03 DIAGNOSIS — M5116 Intervertebral disc disorders with radiculopathy, lumbar region: Secondary | ICD-10-CM | POA: Diagnosis not present

## 2017-10-03 DIAGNOSIS — M6281 Muscle weakness (generalized): Secondary | ICD-10-CM | POA: Diagnosis not present

## 2017-10-03 DIAGNOSIS — M545 Low back pain: Secondary | ICD-10-CM | POA: Diagnosis not present

## 2017-11-02 DIAGNOSIS — Z6827 Body mass index (BMI) 27.0-27.9, adult: Secondary | ICD-10-CM | POA: Diagnosis not present

## 2017-11-02 DIAGNOSIS — Z1231 Encounter for screening mammogram for malignant neoplasm of breast: Secondary | ICD-10-CM | POA: Diagnosis not present

## 2017-11-02 DIAGNOSIS — Z01419 Encounter for gynecological examination (general) (routine) without abnormal findings: Secondary | ICD-10-CM | POA: Diagnosis not present

## 2017-11-18 DIAGNOSIS — Z23 Encounter for immunization: Secondary | ICD-10-CM | POA: Diagnosis not present

## 2017-12-22 IMAGING — US US TRANSVAGINAL NON-OB
1 series · 14 of 25 positions shown · non-contrast
Comparison: 07/04/2015

CLINICAL DATA: Follow-up complex left ovarian cyst

EXAM:
TRANSABDOMINAL AND TRANSVAGINAL ULTRASOUND OF PELVIS
TECHNIQUE: Both transabdominal and transvaginal ultrasound examinations of the
pelvis were performed. Transabdominal technique was performed for
global imaging of the pelvis including uterus, ovaries, adnexal
regions, and pelvic cul-de-sac. It was necessary to proceed with
endovaginal exam following the transabdominal exam to visualize the
left ovary.

[Series 1: us transvaginal non-ob · 0.28mm/px · 14 of 68 slices shown]
[im 1/68]
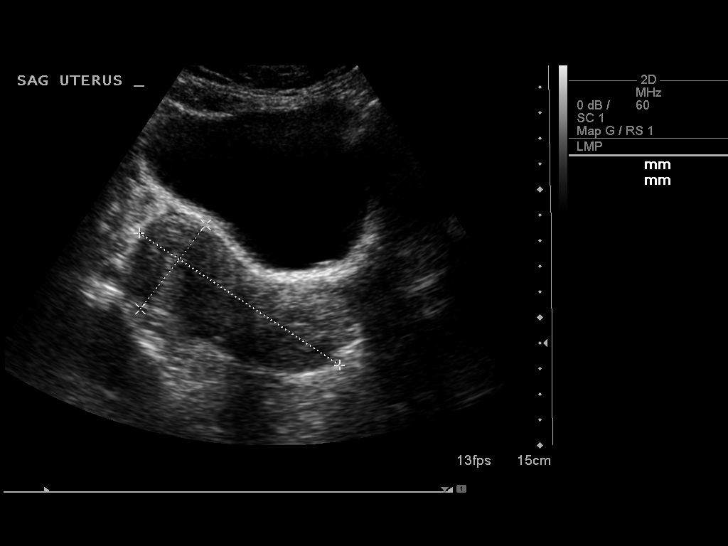
[im 6/68]
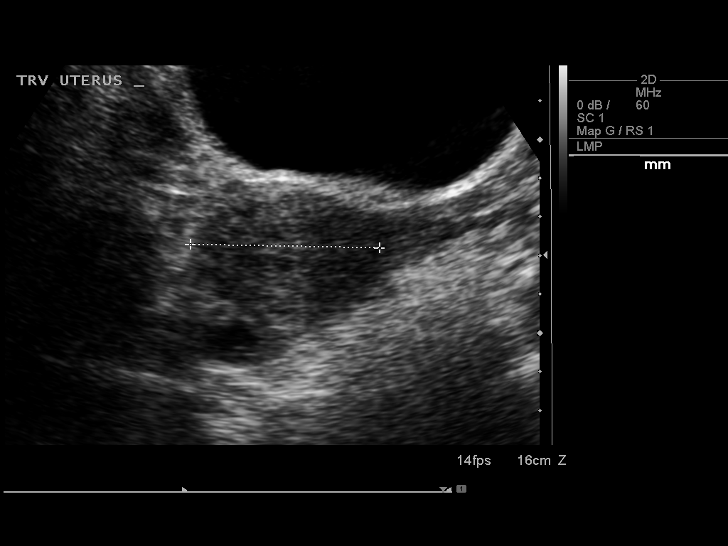
[im 12/68]
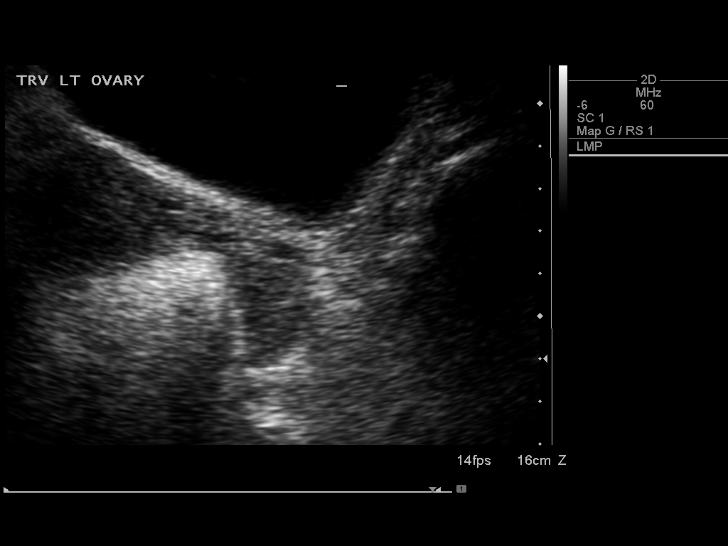
[im 17/68]
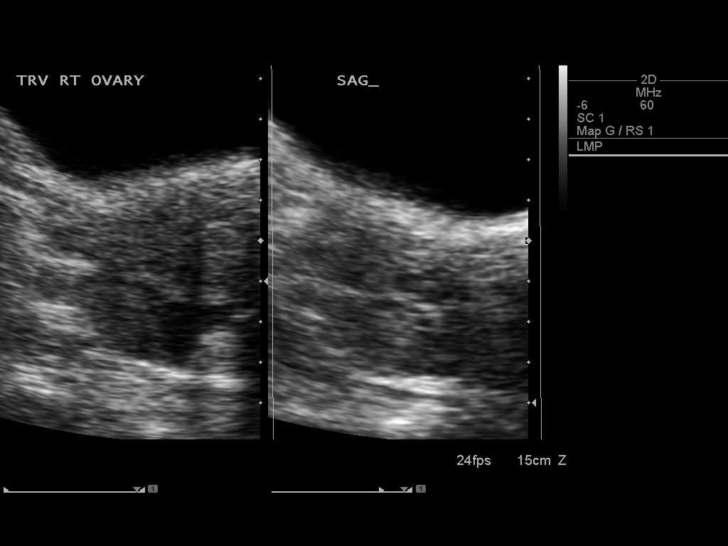
[im 23/68]
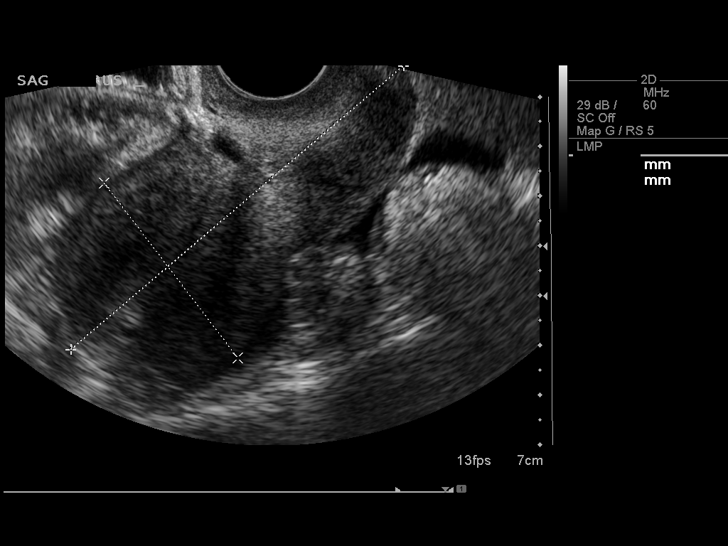
[im 26/68]
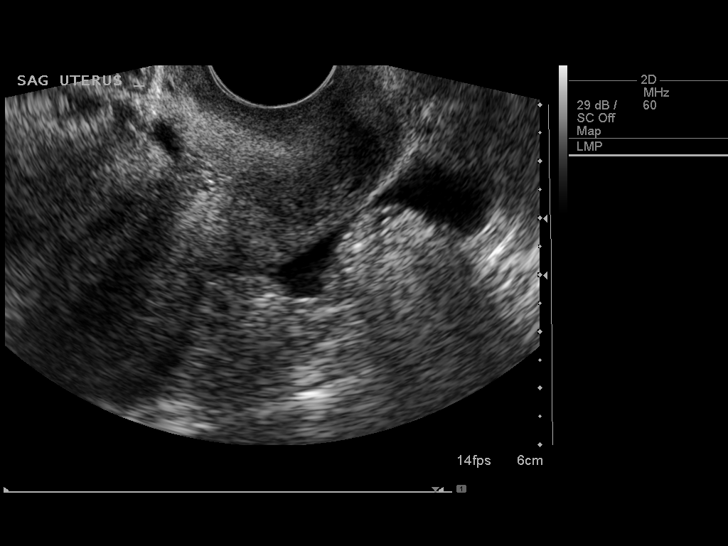
[im 31/68]
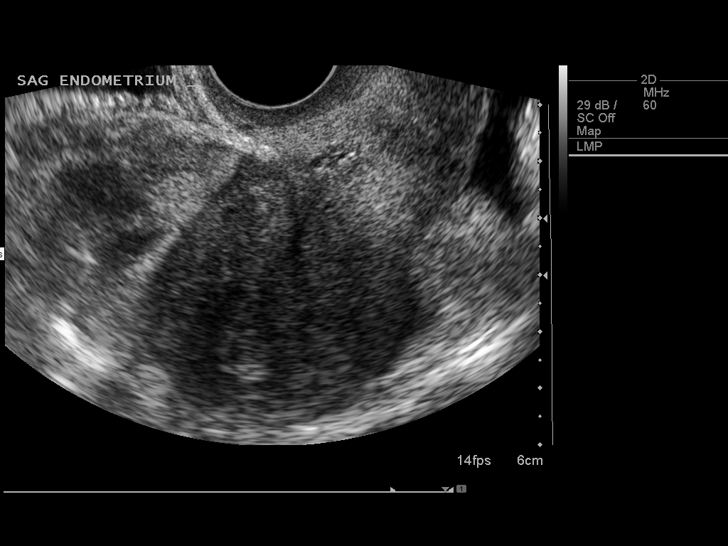
[im 37/68]
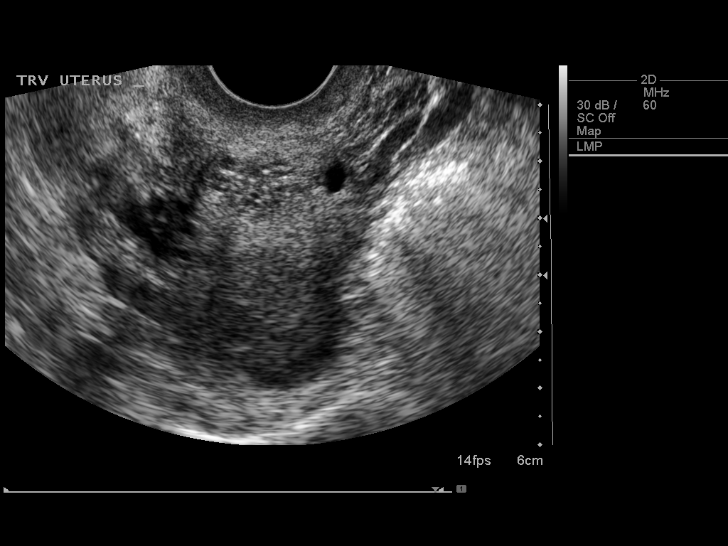
[im 42/68]
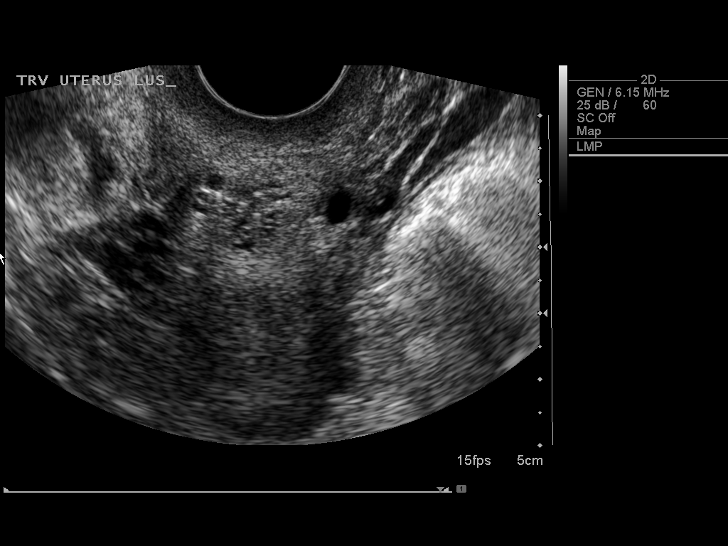
[im 45/68]
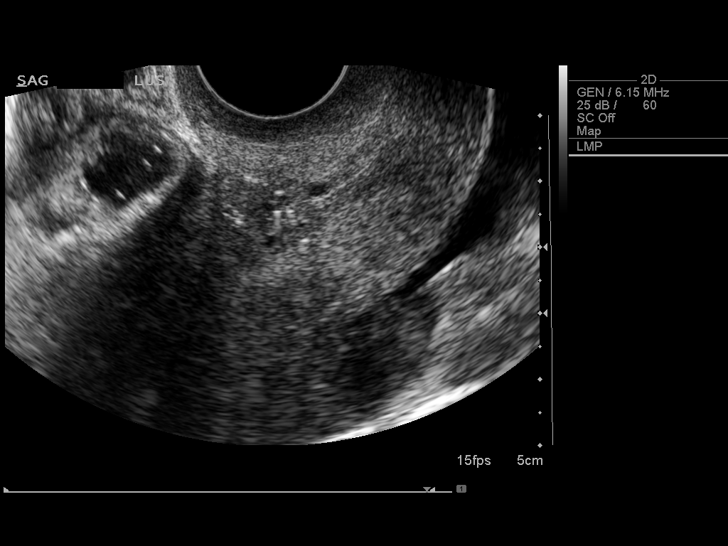
[im 51/68]
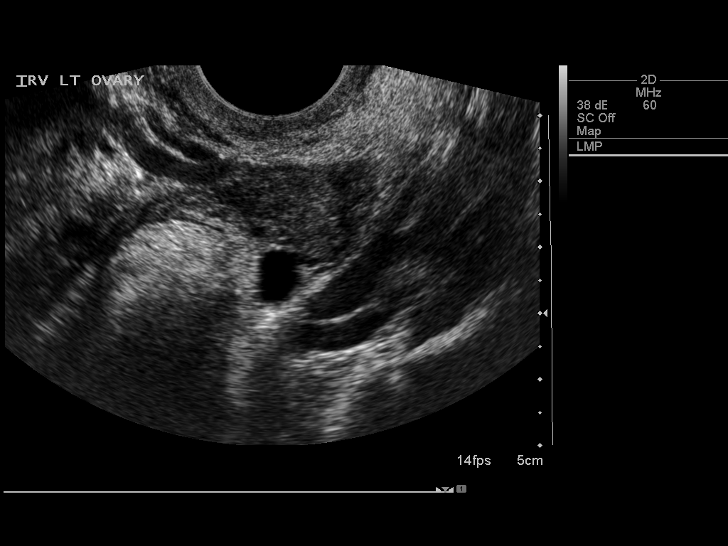
[im 56/68]
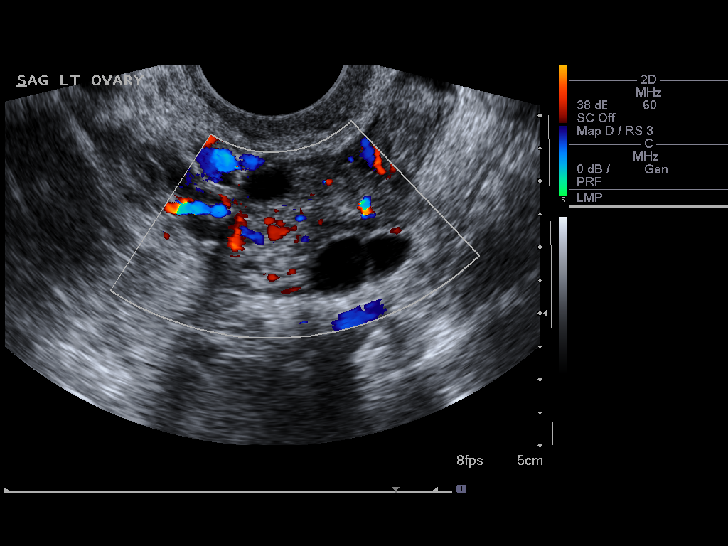
[im 62/68]
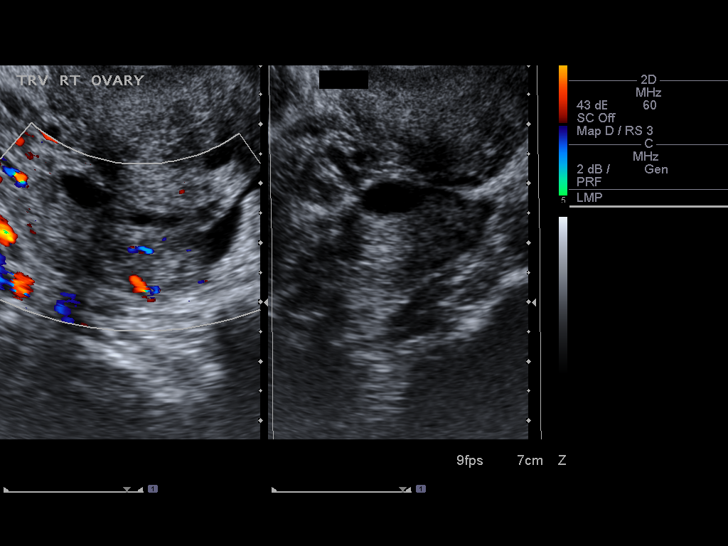
[im 68/68]
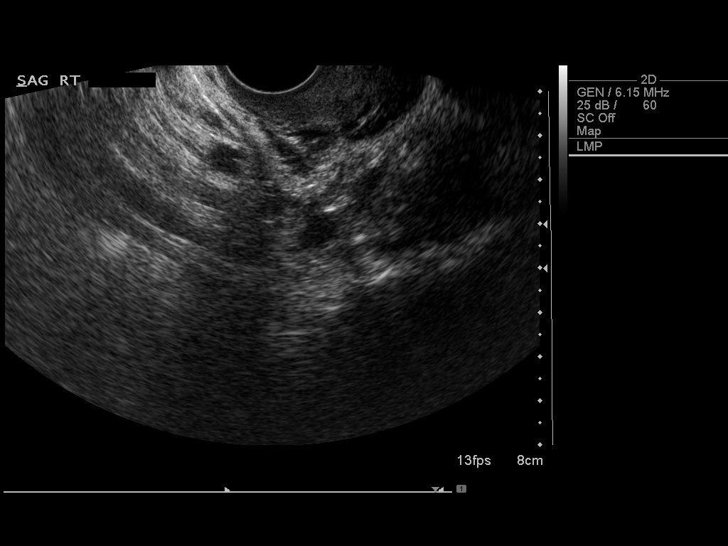

[14 of 25 positions shown; findings below may reference images not displayed]

FINDINGS: Uterus

Measurements: 8.8 x 4.4 x 5.2 cm. C-section scar in the lower
uterine segment.

Endometrium

Thickness: 9 mm.  No focal abnormality visualized.

Right ovary

Measurements: 3.8 x 1.7 x 2.7 cm. Normal appearance/no adnexal mass.

Left ovary

Measurements: 2.8 x 2.3 x 2.0 cm. Normal appearance/no adnexal mass.
Prior complex cyst has resolved.

Other findings

Trace pelvic fluid.
IMPRESSION: Negative pelvic ultrasound.

Prior complex left ovarian cyst has resolved.

## 2018-02-21 DIAGNOSIS — K219 Gastro-esophageal reflux disease without esophagitis: Secondary | ICD-10-CM | POA: Insufficient documentation

## 2018-02-21 DIAGNOSIS — M1990 Unspecified osteoarthritis, unspecified site: Secondary | ICD-10-CM | POA: Diagnosis not present

## 2018-02-21 DIAGNOSIS — D3131 Benign neoplasm of right choroid: Secondary | ICD-10-CM | POA: Diagnosis not present

## 2018-02-21 DIAGNOSIS — H04213 Epiphora due to excess lacrimation, bilateral lacrimal glands: Secondary | ICD-10-CM | POA: Diagnosis not present

## 2018-11-06 ENCOUNTER — Other Ambulatory Visit: Payer: Self-pay | Admitting: Obstetrics and Gynecology

## 2018-11-06 DIAGNOSIS — N644 Mastodynia: Secondary | ICD-10-CM

## 2018-11-13 ENCOUNTER — Ambulatory Visit
Admission: RE | Admit: 2018-11-13 | Discharge: 2018-11-13 | Disposition: A | Discharge status: Home / Self Care | Payer: BC Managed Care – PPO | Source: Ambulatory Visit | Attending: Obstetrics and Gynecology | Admitting: Obstetrics and Gynecology

## 2018-11-13 ENCOUNTER — Ambulatory Visit
Admission: RE | Admit: 2018-11-13 | Discharge: 2018-11-13 | Disposition: A | Discharge status: Home / Self Care | Payer: BLUE CROSS/BLUE SHIELD | Source: Ambulatory Visit | Attending: Obstetrics and Gynecology | Admitting: Obstetrics and Gynecology

## 2018-11-13 ENCOUNTER — Other Ambulatory Visit: Payer: Self-pay

## 2018-11-13 DIAGNOSIS — N644 Mastodynia: Secondary | ICD-10-CM

## 2018-11-13 DIAGNOSIS — R928 Other abnormal and inconclusive findings on diagnostic imaging of breast: Secondary | ICD-10-CM | POA: Diagnosis not present

## 2018-12-18 DIAGNOSIS — Z01419 Encounter for gynecological examination (general) (routine) without abnormal findings: Secondary | ICD-10-CM | POA: Diagnosis not present

## 2018-12-18 DIAGNOSIS — Z6828 Body mass index (BMI) 28.0-28.9, adult: Secondary | ICD-10-CM | POA: Diagnosis not present

## 2018-12-25 DIAGNOSIS — H60312 Diffuse otitis externa, left ear: Secondary | ICD-10-CM | POA: Diagnosis not present

## 2020-05-15 NOTE — Progress Notes (Signed)
Office Visit Note  Patient: Michele Erickson             Date of Birth: 12-10-1970           MRN: [id number]             PCP: Shirlean Mylar, MD Referring: Richarda Overlie, MD Visit Date: 05/16/2020 Occupation: Assists with small tiling business  Subjective:  New Patient (Initial Visit) (Total body pain, lyme's disease, eyes water constantly, weakness, patient has not had COVID vaccines)   History of Present Illness: Michele Erickson is a 50 y.o. female here for evaluation and treatment of osteoarthritis. She has a long history of joint pain that originally started decades ago with inflammation involving her hands and knees found to be from Lyme disease. Since then some amount of joint pain has been common but the past 2 years have a significant increase in symptoms all over. Especially she notices pain in her hands and worst are her knees especially when kneeling or descending a slope or stairs. It hurts pretty constantly throughout the day some days better than others. Not associated with swelling. Partial improvement to NSAIDs or tylenol. Never had any joint surgery or major joint injuries. No major medical events although is still experiencing hot flashes as menopause symptoms.  Activities of Daily Living:  Patient reports morning stiffness for 24 hours.   Patient Denies nocturnal pain.  Difficulty dressing/grooming: Denies Difficulty climbing stairs: Denies Difficulty getting out of chair: Reports Difficulty using hands for taps, buttons, cutlery, and/or writing: Reports  Review of Systems  Constitutional: Positive for fatigue.  HENT: Negative for mouth dryness.   Eyes: Negative for dryness.  Respiratory: Negative for shortness of breath.   Cardiovascular: Positive for swelling in legs/feet.  Gastrointestinal: Negative for constipation.  Endocrine: Positive for cold intolerance and heat intolerance.  Genitourinary: Negative for difficulty urinating.  Musculoskeletal: Positive for  arthralgias, gait problem, joint pain, muscle weakness and morning stiffness.  Skin: Negative for rash.  Allergic/Immunologic: Negative.   Neurological: Positive for numbness and weakness.  Hematological: Positive for bruising/bleeding tendency.  Psychiatric/Behavioral: Positive for sleep disturbance.    PMFS History:  Patient Active Problem List   Diagnosis Date Noted  . Recurrent urinary tract infection 05/16/2020  . Generalized osteoarthritis of multiple sites 05/16/2020  . Patellofemoral pain syndrome of both knees 05/16/2020  . Laryngopharyngeal reflux (LPR) 02/21/2018  . Varicose veins of leg with complications 02/13/2015  . GERD (gastroesophageal reflux disease) s/p lap hiatal hernia repair and Nissen fundoplication 12/27/2013    Past Medical History:  Diagnosis Date  . Anxiety   . Family history of adverse reaction to anesthesia    mother nausea and vomiting   . GERD (gastroesophageal reflux disease)   . History of hiatal hernia   . Lyme disease   . Sleep apnea    no cpap   . Varicose veins     Family History  Problem Relation Age of Onset  . Heart disease Father    Past Surgical History:  Procedure Laterality Date  . 24 HOUR PH STUDY N/A 10/29/2013   Procedure: 24 HOUR PH STUDY;  Surgeon: Barrie Folk, MD;  Location: [facility];  Service: Endoscopy;  Laterality: N/A;  . CESAREAN SECTION     x 2  . LAPAROSCOPIC NISSEN FUNDOPLICATION N/A 12/27/2013   Procedure: LAPAROSCOPIC NISSEN FUNDOPLICATION, laparoscopic hiatal hernia repair;  Surgeon: Avel Peace, MD;  Location: [facility];  Service: General;  Laterality: N/A;  . NASAL  SEPTUM SURGERY     turbinate reduction surgery   . TEMPOROMANDIBULAR JOINT SURGERY     Social History   Social History Narrative  . Not on file   Immunization History  Administered Date(s) Administered  . Influenza,inj,Quad PF,6+ Mos 01/05/2016     Objective: Vital Signs: BP 128/74 (BP Location: Right Arm, Patient Position:  Sitting, Cuff Size: Normal)   Pulse 77   Resp 14   Ht 5' 3.5" (1.613 m)   Wt 160 lb (72.6 kg)   BMI 27.90 kg/m    Physical Exam HENT:     Right Ear: External ear normal.     Left Ear: External ear normal.     Mouth/Throat:     Mouth: Mucous membranes are moist.     Pharynx: Oropharynx is clear.  Eyes:     Conjunctiva/sclera: Conjunctivae normal.     Comments: Watering during encounter  Cardiovascular:     Rate and Rhythm: Normal rate and regular rhythm.  Pulmonary:     Effort: Pulmonary effort is normal.     Breath sounds: Normal breath sounds.  Skin:    General: Skin is warm and dry.     Findings: No rash.  Neurological:     General: No focal deficit present.     Mental Status: She is alert.     Comments: Somewhat brisk b/l LE reflexes  Psychiatric:        Mood and Affect: Mood normal.     Musculoskeletal Exam:  Neck full ROM no tenderness Shoulders full ROM no tenderness or swelling Elbows full ROM no tenderness or swelling Wrists full ROM no tenderness or swelling Fingers full ROM no tenderness or swelling heberdon's nodes present at DIP joints no synovitis Hip normal internal and external rotation without pain, mild tenderness to right side lateral hip palpation Knees full ROM no swelling, patellofemoral crepitus present, positive patellar grind, medial joint line tenderness Ankles full ROM no tenderness or swelling MTPs full ROM no tenderness or swelling  Investigation: No additional findings.  Imaging: No results found.  Recent Labs: Lab Results  Component Value Date   WBC 6.7 12/20/2013   HGB 13.8 12/20/2013   PLT 227 12/20/2013   NA 141 12/20/2013   K 4.2 12/20/2013   CL 103 12/20/2013   CO2 26 12/20/2013   GLUCOSE 85 12/20/2013   BUN 14 12/20/2013   CREATININE 0.85 12/20/2013   BILITOT 0.4 12/20/2013   ALKPHOS 62 12/20/2013   AST 14 12/20/2013   ALT 13 12/20/2013   PROT 8.2 12/20/2013   ALBUMIN 4.5 12/20/2013   CALCIUM 9.8 12/20/2013    GFRAA >90 12/20/2013    Speciality Comments: No specialty comments available.  Procedures:  No procedures performed Allergies: Cephalexin, Cephalexin, Demerol, Meperidine, and Meperidine hcl   Assessment / Plan:     Visit Diagnoses: Generalized osteoarthritis of multiple sites  Symptoms are very consistent with osteoarthritis. OA symptoms can be increased around menopause despite relatively same degree of joint damage progression. She is cautious about extra medicines and treatments. Discussed lower extremity strengthening exercises, knee bracing, stretching and nonimpactful exercise, as well as NSAID use, analgesics, options such as injection in future. She has upcoming repeated bone density scan, last suggested osteopenia. This will be useful, ruling out secondary OA causes. If symptoms are worsening would obtain basic radiographs as well.  Patellofemoral pain syndrome of both knees  Anterior knee pain, worse with flexed position, positive grind, crepitus all suggest this. Discussed conservative management as  detailed above.  Orders: No orders of the defined types were placed in this encounter.  No orders of the defined types were placed in this encounter.   Follow-Up Instructions: Return in about 3 months (around 08/15/2020) for Osteoarthritis and PFPS f/u.   Fuller Plan, MD  Note - This record has been created using [employer].  Chart creation errors have been sought, but may not always  have been located. Such creation errors do not reflect on  the standard of medical care.

## 2020-05-15 NOTE — Progress Notes (Incomplete)
Office Visit Note  Patient: Michele Erickson             Date of Birth: 06/02/1970           MRN: [id number]             PCP: Shirlean Mylar, MD Referring: Richarda Overlie, MD Visit Date: 05/16/2020 Occupation: @GUAROCC @  Subjective:  No chief complaint on file.   History of Present Illness: Michele Erickson is a 49 y.o. female here for evaluation and treatment of osteoarthritis.***   Activities of Daily Living:  Patient reports morning stiffness for *** {minute/hour:19697}.   Patient {ACTIONS;DENIES/REPORTS:21021675::"Denies"} nocturnal pain.  Difficulty dressing/grooming: {ACTIONS;DENIES/REPORTS:21021675::"Denies"} Difficulty climbing stairs: {ACTIONS;DENIES/REPORTS:21021675::"Denies"} Difficulty getting out of chair: {ACTIONS;DENIES/REPORTS:21021675::"Denies"} Difficulty using hands for taps, buttons, cutlery, and/or writing: {ACTIONS;DENIES/REPORTS:21021675::"Denies"}  No Rheumatology ROS completed.   PMFS History:  Patient Active Problem List   Diagnosis Date Noted  . Varicose veins of leg with complications 02/13/2015  . GERD (gastroesophageal reflux disease) s/p lap hiatal hernia repair and Nissen fundoplication 12/27/2013    Past Medical History:  Diagnosis Date  . Anxiety   . Family history of adverse reaction to anesthesia    mother nausea and vomiting   . GERD (gastroesophageal reflux disease)   . History of hiatal hernia   . Sleep apnea    no cpap   . Varicose veins     Family History  Problem Relation Age of Onset  . Heart disease Father    Past Surgical History:  Procedure Laterality Date  . 24 HOUR PH STUDY N/A 10/29/2013   Procedure: 24 HOUR PH STUDY;  Surgeon: 10/31/2013, MD;  Location: [facility];  Service: Endoscopy;  Laterality: N/A;  . CESAREAN SECTION     x 2  . LAPAROSCOPIC NISSEN FUNDOPLICATION N/A 12/27/2013   Procedure: LAPAROSCOPIC NISSEN FUNDOPLICATION, laparoscopic hiatal hernia repair;  Surgeon: 12/29/2013, MD;  Location: [facility];   Service: General;  Laterality: N/A;  . NASAL SEPTUM SURGERY     turbinate reduction surgery   . TEMPOROMANDIBULAR JOINT SURGERY     Social History   Social History Narrative  . Not on file    There is no immunization history on file for this patient.   Objective: Vital Signs: There were no vitals taken for this visit.   Physical Exam   Musculoskeletal Exam: ***  CDAI Exam: CDAI Score: - Patient Global: -; Provider Global: - Swollen: -; Tender: - Joint Exam 05/16/2020   No joint exam has been documented for this visit   There is currently no information documented on the homunculus. Go to the Rheumatology activity and complete the homunculus joint exam.  Investigation: No additional findings.  Imaging: No results found.  Recent Labs: Lab Results  Component Value Date   WBC 6.7 12/20/2013   HGB 13.8 12/20/2013   PLT 227 12/20/2013   NA 141 12/20/2013   K 4.2 12/20/2013   CL 103 12/20/2013   CO2 26 12/20/2013   GLUCOSE 85 12/20/2013   BUN 14 12/20/2013   CREATININE 0.85 12/20/2013   BILITOT 0.4 12/20/2013   ALKPHOS 62 12/20/2013   AST 14 12/20/2013   ALT 13 12/20/2013   PROT 8.2 12/20/2013   ALBUMIN 4.5 12/20/2013   CALCIUM 9.8 12/20/2013   GFRAA >90 12/20/2013    Speciality Comments: No specialty comments available.  Procedures:  No procedures performed Allergies: Cephalexin and Demerol   Assessment / Plan:     Visit Diagnoses: No diagnosis found.  Orders: No orders of the defined types were placed in this encounter.  No orders of the defined types were placed in this encounter.   Face-to-face time spent with patient was *** minutes. Greater than 50% of time was spent in counseling and coordination of care.  Follow-Up Instructions: No follow-ups on file.   Fuller Plan, MD  Note - This record has been created using [employer].  Chart creation errors have been sought, but may not always  have been located. Such creation errors  do not reflect on  the standard of medical care.

## 2020-05-16 ENCOUNTER — Other Ambulatory Visit: Payer: Self-pay

## 2020-05-16 ENCOUNTER — Ambulatory Visit (INDEPENDENT_AMBULATORY_CARE_PROVIDER_SITE_OTHER): Payer: 59 | Admitting: Internal Medicine

## 2020-05-16 ENCOUNTER — Encounter: Payer: Self-pay | Admitting: Internal Medicine

## 2020-05-16 VITALS — BP 128/74 | HR 77 | Resp 14 | Ht 63.5 in | Wt 160.0 lb

## 2020-05-16 DIAGNOSIS — N390 Urinary tract infection, site not specified: Secondary | ICD-10-CM | POA: Insufficient documentation

## 2020-05-16 DIAGNOSIS — M222X1 Patellofemoral disorders, right knee: Secondary | ICD-10-CM

## 2020-05-16 DIAGNOSIS — M159 Polyosteoarthritis, unspecified: Secondary | ICD-10-CM

## 2020-05-16 DIAGNOSIS — M222X2 Patellofemoral disorders, left knee: Secondary | ICD-10-CM | POA: Diagnosis not present

## 2020-05-16 NOTE — Patient Instructions (Addendum)
Naproxen and naproxen sodium oral immediate-release tablets What is this medicine? NAPROXEN (na PROX en) is a non-steroidal anti-inflammatory drug, also known as an NSAID. It treats pain, inflammation, and swelling. This medicine may be used for other purposes; ask your health care provider or pharmacist if you have questions. COMMON BRAND NAME(S): Aflaxen, Aleve, Aleve Arthritis, All Day Pain Relief, All Day Relief, Anaprox, Anaprox DS, Naprosyn, [facility] Naproxen Sodium What should I tell my health care provider before I take this medicine? They need to know if you have any of these conditions:  asthma (lung or breathing disease)  bleeding disorder  coronary artery bypass graft (CABG) within the past 2 weeks  if you often drink alcohol  heart attack  heart disease  heart failure  high blood pressure  high levels of potassium in the blood  kidney disease  liver disease  low red blood cell counts  smoke tobacco cigarettes  stomach bleeding  stomach or intestine problems  take medicines that treat or prevent blood clots  taking steroids such as dexamethasone or prednisone  an unusual or allergic reaction to naproxen, other medicines, foods, dyes, or preservatives  pregnant or trying to get pregnant  breast-feeding How should I use this medicine? Take this medicine by mouth with water. Take it as directed on the prescription label at the same time every day. Do not cut, crush or chew this medicine. Swallow the tablets whole. You can take it with or without food. If it upsets your stomach, take it with food. Keep taking it unless your health care provider tells you to stop. A special MedGuide will be given to you by the pharmacist with each prescription and refill. Be sure to read this information carefully each time. Talk to your health care provider about the use of this medicine in children. While it may be prescribed for to children for selected conditions,  precautions do apply. Overdosage: If you think you have taken too much of this medicine contact a poison control center or emergency room at once. NOTE: This medicine is only for you. Do not share this medicine with others. What if I miss a dose? If you miss a dose, take it as soon as you can. If it is almost time for your next dose, take only that dose. Do not take double or extra doses. What may interact with this medicine?  alcohol  aspirin  cidofovir  diuretics  lithium  methotrexate  other drugs for inflammation like ketorolac or prednisone  pemetrexed  probenecid  warfarin This list may not describe all possible interactions. Give your health care provider a list of all the medicines, herbs, non-prescription drugs, or dietary supplements you use. Also tell them if you smoke, drink alcohol, or use illegal drugs. Some items may interact with your medicine. What should I watch for while using this medicine? Visit your health care provider for regular checks on your progress. Tell your health care provider if your symptoms do not start to get better or if they get worse. Do not take other medicines that contain aspirin, ibuprofen, or naproxen with this medicine. Side effects such as stomach upset, nausea, or ulcers may be more likely to occur. Many non-prescription medicines contain aspirin, ibuprofen, or naproxen. Always read labels carefully. This medicine can cause serious ulcers and bleeding in the stomach. It can happen with no warning. Smoking, drinking alcohol, older age, and poor health can also increase risks. Call your health care provider right away if you  have stomach pain or blood in your vomit or stool. This medicine does not prevent a heart attack or stroke. This medicine may increase the chance of a heart attack or stroke. The chance may increase the longer you use this medicine or if you have heart disease. If you take aspirin to prevent a heart attack or stroke,  talk to your health care provider about using this medicine. Alcohol may interfere with the effect of this medicine. Avoid alcoholic drinks. This medicine may cause serious skin reactions. They can happen weeks to months after starting the medicine. Contact your health care provider right away if you notice fevers or flu-like symptoms with a rash. The rash may be red or purple and then turn into blisters or peeling of the skin. Or, you might notice a red rash with swelling of the face, lips or lymph nodes in your neck or under your arms. Talk to your health care provider if you are pregnant before taking this medicine. Taking this medicine between weeks 20 and 30 of pregnancy may harm your unborn baby. Your health care provider will monitor you closely if you need to take it. After 30 weeks of pregnancy, do not take this medicine. You may get drowsy or dizzy. Do not drive, use machinery, or do anything that needs mental alertness until you know how this medicine affects you. Do not stand up or sit up quickly, especially if you are an older patient. This reduces the risk of dizzy or fainting spells. Be careful brushing or flossing your teeth or using a toothpick because you may get an infection or bleed more easily. If you have any dental work done, tell your dentist you are receiving this medicine. This medicine may make it more difficult to get pregnant. Talk to your health care provider if you are concerned about your fertility. What side effects may I notice from receiving this medicine? Side effects that you should report to your doctor or health care provider as soon as possible:  allergic reactions (skin rash, itching or hives; swelling of the face, lips, or tongue)  bleeding (bloody or black, tarry stools; red or dark brown urine; spitting up blood or brown material that looks like coffee grounds; red spots on the skin; unusual bruising or bleeding from the eyes, gums, or nose)  blood clot  (chest pain; shortness of breath; pain, swelling, or warmth in the leg)  blurred vision OR changes in vision  heart failure (trouble breathing; fast, irregular heartbeat; sudden weight gain; swelling of the ankles, feet, hands; unusually weak or tired)  heart attack (trouble breathing; pain or tightness in the chest, neck, back or arms; unusually weak or tired)  kidney injury (trouble passing urine or change in the amount of urine)  light-colored stool  liver injury (dark yellow or brown urine; general ill feeling or flu-like symptoms; loss of appetite, right upper belly pain; unusually weak or tired, yellowing of the eyes or skin)  low red blood cell counts (trouble breathing; feeling faint; lightheaded, falls; unusually weak or tired)  rash, fever, and swollen lymph nodes  redness, blistering, peeling, or loosening of the skin, including inside the mouth  stroke (changes in vision; confusion; trouble speaking or understanding; severe headaches; sudden numbness or weakness of the face, arm or leg; trouble walking; dizziness; loss of balance or coordination)  trouble breathing Side effects that usually do not require medical attention (report to your doctor or health care provider if they continue or are bothersome):  constipation  diarrhea  dizziness  headache  nausea, vomiting  passing gas This list may not describe all possible side effects. Call your doctor for medical advice about side effects. You may report side effects to FDA at 1-800-FDA-1088. Where should I keep my medicine? Keep out of the reach of children and pets. Store at room temperature between 20 and 25 degrees C (68 and 77 degrees F). Protect from moisture. Keep the container tightly closed. Avoid exposure to extreme heat. Get rid of any unused medicine after the expiration date. To get rid of medicines that are no longer needed or have expired:  Take the medicine to a medicine take-back program. Check  with your pharmacy or law enforcement to find a location.  If you cannot return the medicine, check the label or package insert to see if the medicine should be thrown out in the garbage or flushed down the toilet. If you are not sure, ask your health care provider. If it is safe to put it in the trash, empty the medicine out of the container. Mix the medicine with cat litter, dirt, coffee grounds, or other unwanted substance. Seal the mixture in a bag or container. Put it in the trash.    Osteoarthritis  Osteoarthritis is a type of arthritis. It refers to joint pain or joint disease. Osteoarthritis affects tissue that covers the ends of bones in joints (cartilage). Cartilage acts as a cushion between the bones and helps them move smoothly. Osteoarthritis occurs when cartilage in the joints gets worn down. Osteoarthritis is sometimes called "wear and tear" arthritis. Osteoarthritis is the most common form of arthritis. It often occurs in older people. It is a condition that gets worse over time. The joints most often affected by this condition are in the fingers, toes, hips, knees, and spine, including the neck and lower back. What are the causes? This condition is caused by the wearing down of cartilage that covers the ends of bones. What increases the risk? The following factors may make you more likely to develop this condition:  Being age 52 or older.  Obesity.  Overuse of joints.  Past injury of a joint.  Past surgery on a joint.  Family history of osteoarthritis. What are the signs or symptoms? The main symptoms of this condition are pain, swelling, and stiffness in the joint. Other symptoms may include:  An enlarged joint.  More pain and further damage caused by small pieces of bone or cartilage that break off and float inside of the joint.  Small deposits of bone (osteophytes) that grow on the edges of the joint.  A grating or scraping feeling inside the joint when you move  it.  Popping or creaking sounds when you move.  Difficulty walking or exercising.  An inability to grip items, twist your hand(s), or control the movements of your hands and fingers. How is this diagnosed? This condition may be diagnosed based on:  Your medical history.  A physical exam.  Your symptoms.  X-rays of the affected joint(s).  Blood tests to rule out other types of arthritis. How is this treated? There is no cure for this condition, but treatment can help control pain and improve joint function. Treatment may include a combination of therapies, such as:  Pain relief techniques, such as: ? Applying heat and cold to the joint. ? Massage. ? A form of talk therapy called cognitive behavioral therapy (CBT). This therapy helps you set goals and follow up on the changes  that you make.  Medicines for pain and inflammation. The medicines can be taken by mouth or applied to the skin. They include: ? NSAIDs, such as ibuprofen. ? Prescription medicines. ? Strong anti-inflammatory medicines (corticosteroids). ? Certain nutritional supplements.  A prescribed exercise program. You may work with a physical therapist.  Assistive devices, such as a brace, wrap, splint, specialized glove, or cane.  A weight control plan.  Surgery, such as: ? An osteotomy. This is done to reposition the bones and relieve pain or to remove loose pieces of bone and cartilage. ? Joint replacement surgery. You may need this surgery if you have advanced osteoarthritis. Follow these instructions at home: Activity  Rest your affected joints as told by your health care provider.  Exercise as told by your health care provider. He or she may recommend specific types of exercise, such as: ? Strengthening exercises. These are done to strengthen the muscles that support joints affected by arthritis. ? Aerobic activities. These are exercises, such as brisk walking or water aerobics, that increase your heart  rate. ? Range-of-motion activities. These help your joints move more easily. ? Balance and agility exercises. Managing pain, stiffness, and swelling  If directed, apply heat to the affected area as often as told by your health care provider. Use the heat source that your health care provider recommends, such as a moist heat pack or a heating pad. ? If you have a removable assistive device, remove it as told by your health care provider. ? Place a towel between your skin and the heat source. If your health care provider tells you to keep the assistive device on while you apply heat, place a towel between the assistive device and the heat source. ? Leave the heat on for 20-30 minutes. ? Remove the heat if your skin turns bright red. This is especially important if you are unable to feel pain, heat, or cold. You may have a greater risk of getting burned.  If directed, put ice on the affected area. To do this: ? If you have a removable assistive device, remove it as told by your health care provider. ? Put ice in a plastic bag. ? Place a towel between your skin and the bag. If your health care provider tells you to keep the assistive device on during icing, place a towel between the assistive device and the bag. ? Leave the ice on for 20 minutes, 2-3 times a day. ? Move your fingers or toes often to reduce stiffness and swelling. ? Raise (elevate) the injured area above the level of your heart while you are sitting or lying down.      General instructions  Take over-the-counter and prescription medicines only as told by your health care provider.  Maintain a healthy weight. Follow instructions from your health care provider for weight control.  Do not use any products that contain nicotine or tobacco, such as cigarettes, e-cigarettes, and chewing tobacco. If you need help quitting, ask your health care provider.  Use assistive devices as told by your health care provider.  Keep all  follow-up visits as told by your health care provider. This is important. Where to find more information  [employer] of Arthritis and Musculoskeletal and Skin Diseases: www.niams.http://www.myers.net/  [employer] on Aging: https://walker.com/  American College of Rheumatology: www.rheumatology.org Contact a health care provider if:  You have redness, swelling, or a feeling of warmth in a joint that gets worse.  You have a fever along with  joint or muscle aches.  You develop a rash.  You have trouble doing your normal activities. Get help right away if:  You have pain that gets worse and is not relieved by pain medicine. Summary  Osteoarthritis is a type of arthritis that affects tissue covering the ends of bones in joints (cartilage).  This condition is caused by the wearing down of cartilage that covers the ends of bones.  The main symptom of this condition is pain, swelling, and stiffness in the joint.  There is no cure for this condition, but treatment can help control pain and improve joint function.    Patellofemoral Pain Syndrome  Patellofemoral pain syndrome is a condition in which the tissue (cartilage) on the underside of the kneecap (patella) softens or breaks down. This causes pain in the front of the knee. The condition is also called runner's knee or chondromalacia patella. Patellofemoral pain syndrome is most common in young adults who are active in sports. The knee is the largest joint in the body. The patella covers the front of the knee and is attached to muscles above and below the knee. The underside of the patella is covered with a smooth type of cartilage (synovium). The smooth surface helps the patella glide easily when you move your knee. Patellofemoral pain syndrome causes swelling in the joint linings and bone surfaces in the knee. What are the causes? This condition may be caused by:  Overuse of the knee.  Poor alignment of your knee joints.  Weak  leg muscles.  A direct hit to your kneecap. What increases the risk? You are more likely to develop this condition if:  You do a lot of activities that can wear down your kneecap. These include: ? Running. ? Squatting. ? Climbing stairs.  You start a new physical activity or exercise program.  You wear shoes that do not fit well.  You do not have good leg strength.  You are overweight. What are the signs or symptoms? The main symptom of this condition is knee pain. This may feel like a dull, aching pain underneath your patella, in the front of your knee. There may be a popping or cracking sound when you move your knee. Pain may get worse with:  Exercise.  Climbing stairs.  Running.  Jumping.  Squatting.  Kneeling.  Sitting for a long time.  Moving or pushing on your patella. How is this diagnosed? This condition may be diagnosed based on:  Your symptoms and medical history. You may be asked about your recent physical activities and which ones cause knee pain.  A physical exam. This may include: ? Moving your patella back and forth. ? Checking your range of knee motion. ? Having you squat or jump to see if you have pain. ? Checking the strength of your leg muscles.  Imaging tests to confirm the diagnosis. These may include an MRI of your knee. How is this treated? This condition may be treated at home with rest, ice, compression, and elevation (Obaloluwa Delatte).  Other treatments may include:  NSAIDs, such as ibuprofen.  Physical therapy to stretch and strengthen your leg muscles.  Shoe inserts (orthotics) to take stress off your knee.  A knee brace or knee support.  Adhesive tapes to the skin.  Surgery to remove damaged cartilage or move the patella to a better position. This is rare. Follow these instructions at home: If you have a brace:  Wear the brace as told by your health care provider. Remove  it only as told by your health care provider.  Loosen the  brace if your toes tingle, become numb, or turn cold and blue.  Keep the brace clean.  If the brace is not waterproof: ? Do not let it get wet. ? Cover it with a watertight covering when you take a bath or a shower. Managing pain, stiffness, and swelling  If directed, put ice on the painful area. To do this: ? If you have a removable brace, remove it as told by your health care provider. ? Put ice in a plastic bag. ? Place a towel between your skin and the bag. ? Leave the ice on for 20 minutes, 2-3 times a day. ? Remove the ice if your skin turns bright red. This is very important. If you cannot feel pain, heat, or cold, you have a greater risk of damage to the area.  Move your toes often to reduce stiffness and swelling.  Raise (elevate) the injured area above the level of your heart while you are sitting or lying down.   Activity  Rest your knee.  Avoid activities that cause knee pain.  Perform stretching and strengthening exercises as told by your health care provider or physical therapist.  Return to your normal activities as told by your health care provider. Ask your health care provider what activities are safe for you. General instructions  Take over-the-counter and prescription medicines only as told by your health care provider.  Use splints, braces, knee supports, or walking aids as directed by your health care provider.  Do not use any products that contain nicotine or tobacco, such as cigarettes, e-cigarettes, and chewing tobacco. These can delay healing. If you need help quitting, ask your health care provider.  Keep all follow-up visits. This is important. Contact a health care provider if:  Your symptoms get worse.  You are not improving with home care. Summary  Patellofemoral pain syndrome is a condition in which the tissue (cartilage) on the underside of the kneecap (patella) softens or breaks down.  This condition causes swelling in the joint linings  and bone surfaces in the knee. This leads to pain in the front of the knee.  This condition may be treated at home with rest, ice, compression, and elevation (Tabatha Razzano).  Use splints, braces, knee supports, or walking aids as directed by your health care provider. This information is not intended to replace advice given to you by your health care provider. Make sure you discuss any questions you have with your health care provider. Document Revised: 07/11/2019 Document Reviewed: 07/11/2019 [employer]vier Patient Education  2021 [employer].

## 2020-08-15 ENCOUNTER — Ambulatory Visit (INDEPENDENT_AMBULATORY_CARE_PROVIDER_SITE_OTHER): Payer: 59 | Admitting: Internal Medicine

## 2020-08-15 ENCOUNTER — Ambulatory Visit: Payer: Self-pay

## 2020-08-15 ENCOUNTER — Encounter: Payer: Self-pay | Admitting: Internal Medicine

## 2020-08-15 ENCOUNTER — Other Ambulatory Visit: Payer: Self-pay

## 2020-08-15 ENCOUNTER — Encounter (INDEPENDENT_AMBULATORY_CARE_PROVIDER_SITE_OTHER): Payer: Self-pay

## 2020-08-15 VITALS — BP 110/75 | HR 67 | Ht 63.0 in | Wt 164.4 lb

## 2020-08-15 DIAGNOSIS — M25552 Pain in left hip: Secondary | ICD-10-CM | POA: Diagnosis not present

## 2020-08-15 DIAGNOSIS — M222X1 Patellofemoral disorders, right knee: Secondary | ICD-10-CM

## 2020-08-15 DIAGNOSIS — M25562 Pain in left knee: Secondary | ICD-10-CM | POA: Diagnosis not present

## 2020-08-15 DIAGNOSIS — M222X2 Patellofemoral disorders, left knee: Secondary | ICD-10-CM | POA: Diagnosis not present

## 2020-08-15 DIAGNOSIS — M25551 Pain in right hip: Secondary | ICD-10-CM

## 2020-08-15 DIAGNOSIS — M25561 Pain in right knee: Secondary | ICD-10-CM | POA: Diagnosis not present

## 2020-08-15 DIAGNOSIS — M159 Polyosteoarthritis, unspecified: Secondary | ICD-10-CM | POA: Diagnosis not present

## 2020-08-15 DIAGNOSIS — G8929 Other chronic pain: Secondary | ICD-10-CM

## 2020-08-15 NOTE — Progress Notes (Signed)
Office Visit Note  Patient: Michele Erickson             Date of Birth: [birth year]           MRN: [id number]             PCP: Shirlean Mylar, MD Referring: Shirlean Mylar, MD Visit Date: 08/15/2020   Subjective:  Follow-up (Patient complains of continued generalized pain, specifically low back pain. Patient also complains of overall muscle weakness. )   History of Present Illness: Michele Erickson is a 50 y.o. female here for follow up for generalized joint pains with osteoarthritis and suspected patellofemoral pain syndrome. She was scheduled to obtain updated bone densitometry testing after our last visit and this was obtained her updated report demonstrates osteopenia of the lumbar spine with normal bone density of bilateral femoral necks.  By my review the report does appear technically adequate.  She continues to experience fairly generalized pain currently is worst in the low back and bilateral hips going down to the knees.  This is worsened by weightbearing but also has particular problems rising from seated positions and climbing stairs also gets pain provoked when lying on her side in bed.  She denies much trouble with her upper extremities.  Previous HPI: 05/16/20 Michele Erickson is a 50 y.o. female here for evaluation and treatment of osteoarthritis. She has a long history of joint pain that originally started decades ago with inflammation involving her hands and knees found to be from Lyme disease. Since then some amount of joint pain has been common but the past 2 years have a significant increase in symptoms all over. Especially she notices pain in her hands and worst are her knees especially when kneeling or descending a slope or stairs. It hurts pretty constantly throughout the day some days better than others. Not associated with swelling. Partial improvement to NSAIDs or tylenol. Never had any joint surgery or major joint injuries. No major medical events although is still experiencing hot  flashes as menopause symptoms.   Review of Systems  Constitutional:  Positive for fatigue.  HENT:  Negative for mouth sores, mouth dryness and nose dryness.   Eyes:  Negative for pain, itching, visual disturbance and dryness.  Respiratory:  Negative for cough, hemoptysis, shortness of breath and difficulty breathing.   Cardiovascular:  Positive for irregular heartbeat. Negative for chest pain, palpitations and swelling in legs/feet.  Gastrointestinal:  Negative for abdominal pain, blood in stool, constipation and diarrhea.  Endocrine: Negative for increased urination.  Genitourinary:  Negative for painful urination.  Musculoskeletal:  Positive for joint pain, joint pain, joint swelling, muscle weakness and morning stiffness. Negative for myalgias, muscle tenderness and myalgias.  Skin:  Negative for color change, rash and redness.  Allergic/Immunologic: Negative for susceptible to infections.  Neurological:  Positive for weakness. Negative for dizziness, numbness, headaches and memory loss.  Hematological:  Negative for swollen glands.  Psychiatric/Behavioral:  Negative for confusion and sleep disturbance.    PMFS History:  Patient Active Problem List   Diagnosis Date Noted   Recurrent urinary tract infection 05/16/2020   Generalized osteoarthritis of multiple sites 05/16/2020   Patellofemoral pain syndrome of both knees 05/16/2020   Laryngopharyngeal reflux (LPR) 02/21/2018   Varicose veins of leg with complications 02/13/2015   GERD (gastroesophageal reflux disease) s/p lap hiatal hernia repair and Nissen fundoplication 12/27/2013    Past Medical History:  Diagnosis Date   Anxiety    Family history of adverse  reaction to anesthesia    mother nausea and vomiting    GERD (gastroesophageal reflux disease)    History of hiatal hernia    Lyme disease    Sleep apnea    no cpap    Varicose veins     Family History  Problem Relation Age of Onset   Heart disease Father    Past  Surgical History:  Procedure Laterality Date   22 HOUR PH STUDY N/A 10/29/2013   Procedure: 24 HOUR PH STUDY;  Surgeon: Barrie Folk, MD;  Location: [facility];  Service: Endoscopy;  Laterality: N/A;   CESAREAN SECTION     x 2   LAPAROSCOPIC NISSEN FUNDOPLICATION N/A 12/27/2013   Procedure: LAPAROSCOPIC NISSEN FUNDOPLICATION, laparoscopic hiatal hernia repair;  Surgeon: Avel Peace, MD;  Location: [facility];  Service: General;  Laterality: N/A;   NASAL SEPTUM SURGERY     turbinate reduction surgery    TEMPOROMANDIBULAR JOINT SURGERY     Social History   Social History Narrative   Not on file   Immunization History  Administered Date(s) Administered   Influenza,inj,Quad PF,6+ Mos 01/05/2016     Objective: Vital Signs: BP 110/75 (BP Location: Left Arm, Patient Position: Sitting, Cuff Size: Normal)   Pulse 67   Ht 5\' 3"  (1.6 m)   Wt 164 lb 6.4 oz (74.6 kg)   BMI 29.12 kg/m    Physical Exam Skin:    General: Skin is warm and dry.     Findings: No rash.  Neurological:     Mental Status: She is alert.  Psychiatric:        Mood and Affect: Mood normal.     Musculoskeletal Exam:  Shoulders full ROM no tenderness or swelling Elbows full ROM no tenderness or swelling Wrists full ROM no tenderness or swelling Fingers full ROM no tenderness or swelling Heberden's nodes present in DIP joints of bilateral hands Bilateral hip tenderness to palpation and lateral hip pain is provoked with FABER maneuver Knees full ROM, bilateral patellofemoral crepitus, medial joint line tenderness to palpation with no effusions   Investigation: No additional findings.  Imaging: XR HIPS BILAT W OR W/O PELVIS 2V  Result Date: 08/16/2020 X-ray of bilateral hips 4 views Visualized portion of the lumbar spine demonstrates normal alignment.  SI joints are patent bilaterally with no widening or erosions there is mild sclerotic changes on both sacral and iliac borders more superiorly. Left femoral  acetabular joints demonstrate normal space bilaterally.  Possible bone cyst present in the left femoral neck area. Impression No significant femoral acetabular joint arthritis that would likely explain bilateral hip pain  XR KNEE 3 VIEW LEFT  Result Date: 08/16/2020 X-ray left knee 2 views Medial and lateral compartment joint spaces appear preserved with no significant sclerosis or osteophyte seen.  Early enthesophyte changes at the patella.  No joint swelling or abnormal calcifications are seen. Impression Very mild chronic patellar disease with enthesophytes no other significant arthritis changes are seen  XR KNEE 3 VIEW RIGHT  Result Date: 08/16/2020 X-ray right knee 3 views Medial and lateral compartment joint space appears well-preserved no sclerotic changes or osteophyte formation.  Patellofemoral joint space looks intact there is currently enthesophyte changes on superior border of patella.  No visible soft tissue swelling or abnormal calcifications are seen. Impression Mild chronic patellar changes no other significant arthritis changes seen   Recent Labs: Lab Results  Component Value Date   WBC 6.7 12/20/2013   HGB 13.8 12/20/2013  PLT 227 12/20/2013   NA 141 12/20/2013   K 4.2 12/20/2013   CL 103 12/20/2013   CO2 26 12/20/2013   GLUCOSE 85 12/20/2013   BUN 14 12/20/2013   CREATININE 0.85 12/20/2013   BILITOT 0.4 12/20/2013   ALKPHOS 62 12/20/2013   AST 14 12/20/2013   ALT 13 12/20/2013   PROT 8.2 12/20/2013   ALBUMIN 4.5 12/20/2013   CALCIUM 9.8 12/20/2013   GFRAA >90 12/20/2013    Speciality Comments: No specialty comments available.  Procedures:  No procedures performed Allergies: Cephalexin, Cephalexin, Demerol, Meperidine, and Meperidine hcl   Assessment / Plan:     Visit Diagnoses: Generalized osteoarthritis of multiple sites - Plan: XR KNEE 3 VIEW RIGHT, XR KNEE 3 VIEW LEFT, XR HIPS BILAT W OR W/O PELVIS 2V, Ambulatory referral to Physical Medicine Rehab  She  has joint pain in multiple areas particularly worse right now in the hips and lower extremities though she also has OA changes in her arms.  We will check x-rays of bilateral hips and bilateral knees for any acute abnormality or severe degenerative arthritis that might warrant orthopedic evaluation.  If these are fairly normal would recommend more of physical medicine and rehab approach for assessment and management may also benefit with PT in the future.  Patellofemoral pain syndrome of both knees - Plan: Ambulatory referral to Physical Medicine Rehab  Bilateral knee pain worsened when applying extension from a highly flexed position as well as the anterior knee tenderness and crepitus on exam is all very suggestive for the patellofemoral pain syndrome.  She has apparently done some amount of home exercise but I think a benefit with more directed assessment and instructions will refer I do not see any need for injection or other intervention at this time.  Orders: Orders Placed This Encounter  Procedures   XR KNEE 3 VIEW RIGHT   XR KNEE 3 VIEW LEFT   XR HIPS BILAT W OR W/O PELVIS 2V   Ambulatory referral to Physical Medicine Rehab    No orders of the defined types were placed in this encounter.    Follow-Up Instructions: Return if symptoms worsen or fail to improve.   Fuller Plan, MD  Note - This record has been created using [employer].  Chart creation errors have been sought, but may not always  have been located. Such creation errors do not reflect on  the standard of medical care.

## 2020-08-20 ENCOUNTER — Encounter: Payer: Self-pay | Admitting: Physical Medicine and Rehabilitation

## 2020-10-16 ENCOUNTER — Other Ambulatory Visit: Payer: Self-pay | Admitting: Obstetrics and Gynecology

## 2020-10-16 DIAGNOSIS — N644 Mastodynia: Secondary | ICD-10-CM

## 2020-10-20 ENCOUNTER — Other Ambulatory Visit: Payer: Self-pay

## 2020-10-20 ENCOUNTER — Ambulatory Visit
Admission: RE | Admit: 2020-10-20 | Discharge: 2020-10-20 | Disposition: A | Discharge status: Home / Self Care | Payer: 59 | Source: Ambulatory Visit | Attending: Obstetrics and Gynecology | Admitting: Obstetrics and Gynecology

## 2020-10-20 DIAGNOSIS — N644 Mastodynia: Secondary | ICD-10-CM

## 2020-12-08 ENCOUNTER — Encounter: Payer: 59 | Admitting: Physical Medicine and Rehabilitation

## 2020-12-11 ENCOUNTER — Encounter: Payer: 59 | Attending: Physical Medicine and Rehabilitation | Admitting: Physical Medicine and Rehabilitation

## 2020-12-11 ENCOUNTER — Encounter: Payer: Self-pay | Admitting: Physical Medicine and Rehabilitation

## 2020-12-11 ENCOUNTER — Other Ambulatory Visit: Payer: Self-pay

## 2020-12-11 VITALS — BP 123/81 | HR 80 | Temp 99.2°F | Ht 63.0 in | Wt 160.8 lb

## 2020-12-11 DIAGNOSIS — M797 Fibromyalgia: Secondary | ICD-10-CM | POA: Diagnosis present

## 2020-12-11 MED ORDER — NAC 600 MG PO CAPS: 1.0000 | ORAL_CAPSULE | Freq: Two times a day (BID) | ORAL | 0 refills | Status: AC

## 2020-12-11 NOTE — Patient Instructions (Signed)
[employer]

## 2020-12-11 NOTE — Progress Notes (Signed)
Subjective:    Patient ID: Michele Erickson, female    DOB: [birth year], 50 y.o.   MRN: [id number]  HPI Michele Erickson is a 50 year old woman who presents to establish care for chronic pain.   -she followed with a doctor in [place] when she was 50 years old and was told she has Lyme and would have issues in 30-40 years -in the past few years her pain has been really bad -she feels very weak in her lowe back.  -her knees are screaming- she was diagnosed with patellofemoral syndrome bilaterally -she is with her own grandkids twice per week.  -she feels brain fog, depression, anxiety -she was treated about 6 weeks for antibiotics.  -she has done physical therapy for the bulging   Pain Inventory Average Pain 5 Pain Right Now 5 My pain is intermittent, sharp, dull, and aching  In the last 24 hours, has pain interfered with the following? General activity 6 Relation with others 0 Enjoyment of life 6 What TIME of day is your pain at its worst? daytime, evening, and night Sleep (in general) Fair  Pain is worse with: bending, sitting, and standing Pain improves with: medication Relief from Meds: 5  walk without assistance ability to climb steps?  yes do you drive?  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  no  Dr Sheliah Hatch referral MD    Family History  Problem Relation Age of Onset   Heart disease Father    Social History   Socioeconomic History   Marital status: Married    Spouse name: Not on file   Number of children: Not on file   Years of education: Not on file   Highest education level: Not on file  Occupational History   Not on file  Tobacco Use   Smoking status: Never   Smokeless tobacco: Never  Vaping Use   Vaping Use: Never used  Substance and Sexual Activity   Alcohol use: No    Alcohol/week: 0.0 standard drinks   Drug  use: No   Sexual activity: Not on file  Other Topics Concern   Not on file  Social History Narrative   Not on file   Social Determinants of Health   Financial Resource Strain: Not on file  Food Insecurity: Not on file  Transportation Needs: Not on file  Physical Activity: Not on file  Stress: Not on file  Social Connections: Not on file   Past Surgical History:  Procedure Laterality Date   4 HOUR PH STUDY N/A 10/29/2013   Procedure: 24 HOUR PH STUDY;  Surgeon: Barrie Folk, MD;  Location: [facility];  Service: Endoscopy;  Laterality: N/A;   CESAREAN SECTION     x 2   LAPAROSCOPIC NISSEN FUNDOPLICATION N/A 12/27/2013   Procedure: LAPAROSCOPIC NISSEN FUNDOPLICATION, laparoscopic hiatal hernia repair;  Surgeon: Avel Peace, MD;  Location: [facility];  Service: General;  Laterality: N/A;   NASAL SEPTUM SURGERY     turbinate reduction surgery    TEMPOROMANDIBULAR JOINT SURGERY     Past Medical History:  Diagnosis Date   Anxiety    Family history of adverse reaction to anesthesia    mother nausea and vomiting    GERD (gastroesophageal reflux disease)    History of hiatal hernia    Lyme disease    Sleep apnea    no cpap  Varicose veins    BP 123/81   Pulse 80   Temp 99.2 F (37.3 C)   Ht 5\' 3"  (1.6 m)   Wt 160 lb 12.8 oz (72.9 kg)   SpO2 98%   BMI 28.48 kg/m   Opioid Risk Score:   Fall Risk Score:  `1  Depression screen PHQ 2/9  Depression screen PHQ 2/9 12/11/2020  Decreased Interest 1  Down, Depressed, Hopeless 1  PHQ - 2 Score 2  Altered sleeping 1  Tired, decreased energy 2  Change in appetite 0  Feeling bad or failure about yourself  0  Trouble concentrating 1  Moving slowly or fidgety/restless 2  Suicidal thoughts 0  PHQ-9 Score 8  Difficult doing work/chores Somewhat difficult     Michele Erickson is a 50 year old woman who presents to establish care for diffuse osteoarthritis.    Review of Systems  Constitutional: Negative.   HENT: Negative.     Eyes: Negative.   Respiratory: Negative.    Cardiovascular: Negative.   Gastrointestinal: Negative.   Endocrine: Negative.   Genitourinary: Negative.   Musculoskeletal:  Positive for arthralgias and back pain.  Skin: Negative.   Allergic/Immunologic: Negative.   Neurological:  Positive for weakness and numbness.  Hematological: Negative.   Psychiatric/Behavioral:  Positive for dysphoric mood.   All other systems reviewed and are negative.     Objective:   Physical Exam Gen: no distress, normal appearing HEENT: oral mucosa pink and moist, NCAT Cardio: Reg rate Chest: normal effort, normal rate of breathing Abd: soft, non-distended Ext: no edema Psych: pleasant, normal affect Skin: intact Neuro: Alert and oriented x3 Musculoskeletal: Ligamentous laxity. 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  -Discussed benefits of exercise in reducing pain. -discussed mechanism of action of low dose naltrexone as an opioid receptor antagonist which stimulates your body's production of its own natural endogenous opioids, helping to decrease pain. Discussed that it can also decrease T cell response and thus be helpful in decreasing inflammation, and symptoms of brain fog, fatigue, anxiety, depression, and allergies. Discussed that this medication needs to be compounded at a compounding pharmacy and can more expensive. Discussed that I usually start at 1mg  and if this is not providing enough relief then I titrate upward on a monthly basis.  -Recommended starting NAC (N-Acetyl cysteine) 600mg  BID for her fatigue. Discussed its benefits in boosting glutathione and mitochondrial function.   -Provided with a pain relief journal and discussed that it contains foods and lifestyle tips to naturally help to improve pain. Discussed that these lifestyle strategies are also very good for health  unlike some medications which can have negative side effects. Discussed that the act of keeping a journal can be therapeutic and helpful to realize patterns what helps to trigger and alleviate pain.   -Discussed following foods that may reduce pain: 1) Ginger (especially studied for arthritis)- reduce leukotriene production to decrease inflammation 2) Blueberries- high in phytonutrients that decrease inflammation 3) Salmon- marine omega-3s reduce joint swelling and pain 4) Pumpkin seeds- reduce inflammation 5) dark chocolate- reduces inflammation 6) turmeric- reduces inflammation 7) tart cherries - reduce pain and stiffness 8) extra virgin olive oil - its compound olecanthal helps to block prostaglandins  9) chili peppers- can be eaten or applied topically via capsaicin 10) mint- helpful for headache, muscle aches, joint pain, and itching 11) garlic- reduces inflammation  Link to further information on diet for chronic pain: [email or web address]

## 2021-02-11 ENCOUNTER — Other Ambulatory Visit: Payer: Self-pay | Admitting: Surgery

## 2021-02-11 DIAGNOSIS — R1013 Epigastric pain: Secondary | ICD-10-CM

## 2021-02-11 DIAGNOSIS — R635 Abnormal weight gain: Secondary | ICD-10-CM

## 2021-03-05 ENCOUNTER — Other Ambulatory Visit: Payer: Self-pay

## 2021-03-05 ENCOUNTER — Ambulatory Visit
Admission: RE | Admit: 2021-03-05 | Discharge: 2021-03-05 | Disposition: A | Discharge status: Home / Self Care | Payer: Self-pay | Source: Ambulatory Visit | Attending: Surgery | Admitting: Surgery

## 2021-03-05 DIAGNOSIS — R1013 Epigastric pain: Secondary | ICD-10-CM

## 2021-03-05 DIAGNOSIS — R635 Abnormal weight gain: Secondary | ICD-10-CM

## 2021-03-05 MED ORDER — IOPAMIDOL (ISOVUE-300) INJECTION 61%
100.0000 mL | Freq: Once | INTRAVENOUS | Status: AC | PRN
  Administered 2021-03-05: 100 mL via INTRAVENOUS

## 2021-03-27 ENCOUNTER — Other Ambulatory Visit: Payer: Self-pay | Admitting: Obstetrics and Gynecology

## 2021-03-27 DIAGNOSIS — Z1329 Encounter for screening for other suspected endocrine disorder: Secondary | ICD-10-CM

## 2021-04-02 ENCOUNTER — Ambulatory Visit
Admission: RE | Admit: 2021-04-02 | Discharge: 2021-04-02 | Disposition: A | Discharge status: Home / Self Care | Payer: Managed Care, Other (non HMO) | Source: Ambulatory Visit | Attending: Obstetrics and Gynecology | Admitting: Obstetrics and Gynecology

## 2021-04-02 DIAGNOSIS — Z1329 Encounter for screening for other suspected endocrine disorder: Secondary | ICD-10-CM

## 2021-04-09 ENCOUNTER — Ambulatory Visit: Payer: 59 | Admitting: Physical Medicine and Rehabilitation

## 2021-04-14 ENCOUNTER — Encounter
Payer: Managed Care, Other (non HMO) | Attending: Physical Medicine and Rehabilitation | Admitting: Physical Medicine and Rehabilitation

## 2022-06-11 ENCOUNTER — Ambulatory Visit: Payer: Commercial Managed Care - HMO | Admitting: Podiatry

## 2022-06-11 ENCOUNTER — Encounter: Payer: Self-pay | Admitting: Podiatry

## 2022-06-11 DIAGNOSIS — M722 Plantar fascial fibromatosis: Secondary | ICD-10-CM | POA: Diagnosis not present

## 2022-06-11 MED ORDER — TRIAMCINOLONE ACETONIDE 10 MG/ML IJ SUSP
20.0000 mg | Freq: Once | INTRAMUSCULAR | Status: AC
  Administered 2022-06-11: 20 mg

## 2022-06-11 NOTE — Patient Instructions (Signed)

## 2022-06-13 NOTE — Progress Notes (Signed)
Subjective:   Patient ID: Michele Erickson, female   DOB: 52 y.o.   MRN: [id number]   HPI Patient states she has had a long-term pain in the heel right over left of approximate 5 years duration but worse over the last year and states it is very painful with pressure and at times tingles.  Patient does not smoke likes to be active   Review of Systems  All other systems reviewed and are negative.       Objective:  Physical Exam Vitals and nursing note reviewed.  Constitutional:      Appearance: She is well-developed.  Pulmonary:     Effort: Pulmonary effort is normal.  Musculoskeletal:        General: Normal range of motion.  Skin:    General: Skin is warm.  Neurological:     Mental Status: She is alert.     Neurovascular status intact muscle strength found to be adequate range of motion adequate with exquisite discomfort medial fascial band right over left with depression of the arch noted and discomfort when I palpated directly on the medial tubercle.  Good digital perfusion well-oriented x 3     Assessment:  Acute Planter fasciitis right over left with mechanical dysfunction right over left     Plan:  H&P reviewed and at this point did sterile prep and injected the insertion of the fascia 3 mg Kenalog 5 mg Xylocaine bilateral and applied fascial brace right to hold up the arch fitted properly into the arch with all instructions along with shoe gear modification.  Reappoint to recheck  X-rays indicate minimal spur no indication stress fracture arthritis in conjunction to this chronic problem

## 2022-06-25 ENCOUNTER — Ambulatory Visit: Payer: Commercial Managed Care - HMO | Admitting: Podiatry

## 2022-06-25 DIAGNOSIS — M722 Plantar fascial fibromatosis: Secondary | ICD-10-CM

## 2022-06-28 NOTE — Progress Notes (Signed)
Subjective:   Patient ID: Michele Erickson, female   DOB: 52 y.o.   MRN: [id number]   HPI Patient presents stating that she is doing quite a bit better and that the braces have been very beneficial for her.  States she still gets periodic discomfort but not to the same degree   ROS      Objective:  Physical Exam  Neurovascular status intact significant diminishment of discomfort in the plantar heel bilateral with slight fluid buildup but much improved over where it was previously     Assessment:  Acute Planter fasciitis bilateral which is done very well with medication and bracing     Plan:  H&P reviewed and discussed at great length discussed different treatment options for the future and the fact she has had this a long time.  At this point we will continue brace usage shoe gear modifications and not going barefoot or wearing flats.  Patient will be seen back to recheck may require more aggressive treatment depending on response

## 2022-07-02 ENCOUNTER — Telehealth: Payer: Self-pay | Admitting: *Deleted

## 2022-07-02 ENCOUNTER — Encounter: Payer: Self-pay | Admitting: Podiatry

## 2022-07-02 NOTE — Telephone Encounter (Signed)
Patient is calling because her gel ice pk has caused burns and dark areas on her heels,still hurting, please advise. Sent message thru [employer] as well.

## 2022-08-27 ENCOUNTER — Ambulatory Visit: Payer: Commercial Managed Care - HMO | Admitting: Podiatry

## 2022-09-24 ENCOUNTER — Ambulatory Visit: Payer: Commercial Managed Care - HMO | Admitting: Podiatry

## 2022-10-15 ENCOUNTER — Ambulatory Visit: Payer: Commercial Managed Care - HMO | Admitting: Podiatry

## 2022-10-15 ENCOUNTER — Encounter: Payer: Self-pay | Admitting: Podiatry

## 2022-10-15 DIAGNOSIS — M722 Plantar fascial fibromatosis: Secondary | ICD-10-CM | POA: Diagnosis not present

## 2022-10-17 NOTE — Progress Notes (Signed)
Subjective:   Patient ID: Michele Erickson, female   DOB: 52 y.o.   MRN: [id number]   HPI Patient presents with husband with extreme discomfort plantar fascia right over left foot of approximate 5 years duration.  States that injections were not successful bracing not successful night splint not successful and it is somewhat dominating her life   ROS      Objective:  Physical Exam  Neurovascular status intact with exquisite discomfort noted medial fascial band right at the insertional point tendon calcaneus with fluid buildup and pain left not to the same degree     Assessment:  Acute plantar fasciitis right over left inflammation fluid medial band with pain     Plan:  H&P reviewed and I went ahead today discussed treatment options and due to the long-term intensity failure to respond conservatively at all surgical intervention is recommended.  Discussed procedure risk patient wants surgery and at this point I allowed her to read consent form going over surgical release of the fascial band.  All risks were discussed and understood and at this point I went ahead and I also applied air fracture walker with all instructions on usage fitted properly and patient will utilize this prior to surgery and bring this for the postoperative.

## 2022-10-19 ENCOUNTER — Telehealth: Payer: Self-pay | Admitting: Podiatry

## 2022-10-19 NOTE — Telephone Encounter (Signed)
DOS 11/02/22  EPF [id number]  [employer] EFFECTIVE DATE-02/08/21 DEDUCTIBLE- $500 w/ remaining $0.00 OOP-3,050.00 W/ REMAINING 2,229.61  COINSURANCE-0%  PER [employer] AUTOMATED SYSTEM FOR CPT CODE 64332 NO PRIOR AUTH IS REQUIRED.  CALL REF #: [id number]

## 2022-10-28 ENCOUNTER — Telehealth: Payer: Self-pay | Admitting: Podiatry

## 2022-10-28 NOTE — Telephone Encounter (Signed)
Patient called, noting she is scheduled for surgery on Tuesday, but wants to speak with Dr. Charlsie Merles about it.  She has a lot of questions for him before she goes through with it.    Callback [phone]

## 2022-10-29 NOTE — Telephone Encounter (Signed)
Talked to her

## 2022-11-01 MED ORDER — HYDROCODONE-ACETAMINOPHEN 10-325 MG PO TABS: 1.0000 | ORAL_TABLET | Freq: Three times a day (TID) | ORAL | 0 refills | Status: AC | PRN

## 2022-11-01 NOTE — Addendum Note (Signed)
Addended by: Lenn Sink on: 11/01/2022 02:02 PM   Modules accepted: Orders

## 2022-11-02 DIAGNOSIS — M722 Plantar fascial fibromatosis: Secondary | ICD-10-CM | POA: Diagnosis not present

## 2022-11-08 ENCOUNTER — Encounter: Payer: Self-pay | Admitting: Podiatry

## 2022-11-08 ENCOUNTER — Ambulatory Visit (INDEPENDENT_AMBULATORY_CARE_PROVIDER_SITE_OTHER): Payer: Managed Care, Other (non HMO) | Admitting: Podiatry

## 2022-11-08 VITALS — BP 128/71 | HR 95

## 2022-11-08 DIAGNOSIS — M722 Plantar fascial fibromatosis: Secondary | ICD-10-CM

## 2022-11-08 NOTE — Progress Notes (Signed)
Subjective:   Patient ID: Michele Erickson, female   DOB: 52 y.o.   MRN: [id number]   HPI Patient states everything is doing well and minimal discomfort   ROS      Objective:  Physical Exam  Neurovascular status intact negative Denna Haggard' sign noted wound edges coapted well stitches intact     Assessment:  Doing well after having endoscopic release medial fascial band right     Plan:  Sterile dressing reapplied continue elevation compression immobilization dispensed surgical shoe reappoint to recheck again in several weeks

## 2022-11-24 ENCOUNTER — Encounter: Payer: Self-pay | Admitting: Podiatry

## 2022-11-24 ENCOUNTER — Ambulatory Visit (INDEPENDENT_AMBULATORY_CARE_PROVIDER_SITE_OTHER): Payer: Managed Care, Other (non HMO) | Admitting: Podiatry

## 2022-11-24 ENCOUNTER — Encounter: Payer: Commercial Managed Care - HMO | Admitting: Podiatry

## 2022-11-24 DIAGNOSIS — M722 Plantar fascial fibromatosis: Secondary | ICD-10-CM

## 2022-11-24 NOTE — Progress Notes (Signed)
Subjective:   Patient ID: Michele Erickson, female   DOB: 52 y.o.   MRN: [id number]   HPI Patient states doing very well so far with the heel here for suture removal   ROS      Objective:  Physical Exam  Neurovascular status intact negative Denna Haggard' sign noted wound edges well coapted stitches intact     Assessment:  Doing well post endoscopic surgery right     Plan:  H&P done stitches removed wound edges coapted well applied dressings dispensed ankle compression stocking begin gradual increase in activity with hopeful utilization of shoes within the next 2 weeks and reappoint as needed if any issues were to occur

## 2023-05-05 ENCOUNTER — Other Ambulatory Visit: Payer: Self-pay | Admitting: Family Medicine

## 2023-05-05 DIAGNOSIS — M542 Cervicalgia: Secondary | ICD-10-CM

## 2023-05-07 ENCOUNTER — Encounter: Payer: Self-pay | Admitting: Family Medicine

## 2023-05-11 ENCOUNTER — Ambulatory Visit
Admission: RE | Admit: 2023-05-11 | Discharge: 2023-05-11 | Discharge status: Home / Self Care | Source: Ambulatory Visit | Attending: Family Medicine

## 2023-05-11 DIAGNOSIS — M542 Cervicalgia: Secondary | ICD-10-CM

## 2023-06-29 IMAGING — CT CT ABD-PELV W/ CM
1 of 3 series · 12 of 32 positions shown, 18 images · IV contrast (APPLIED)
Comparison: None.

CLINICAL DATA: Epigastric pain with bloating.

EXAM:
CT ABDOMEN AND PELVIS WITH CONTRAST
TECHNIQUE: Multidetector CT imaging of the abdomen and pelvis was performed
using the standard protocol following bolus administration of
intravenous contrast.

[Series 2: abd/pelvis w/cm · axial · 0.79mm/px · z∈[+383,+758]mm · 12 of 89 slices shown, 18 images]
[im 7/89  soft-tissue]
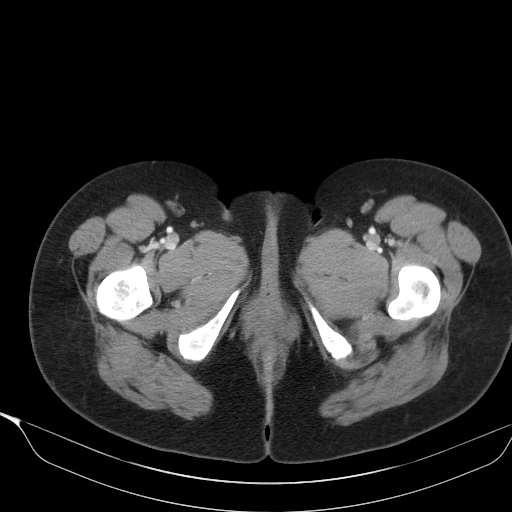
[im 7/89  bone]
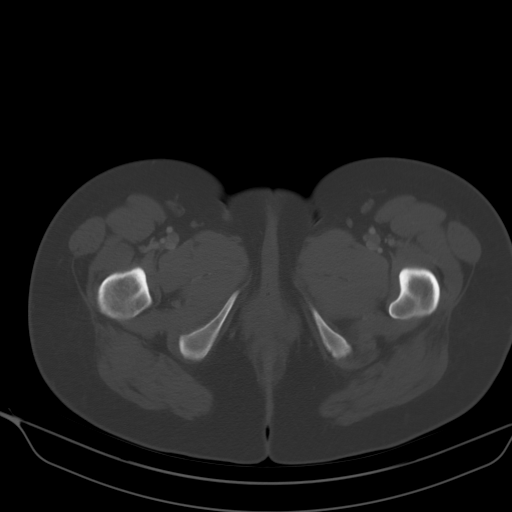
[im 14/89  soft-tissue]
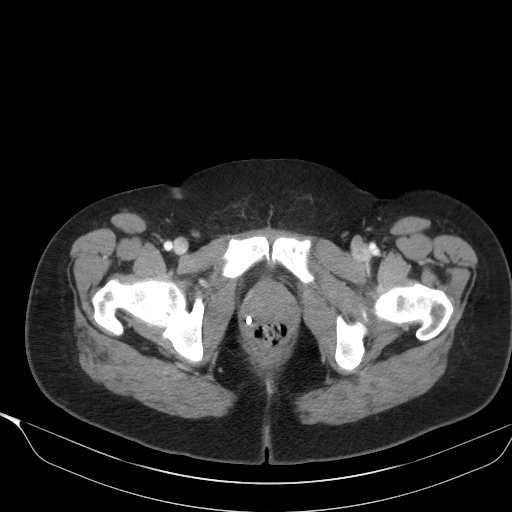
[im 21/89  soft-tissue]
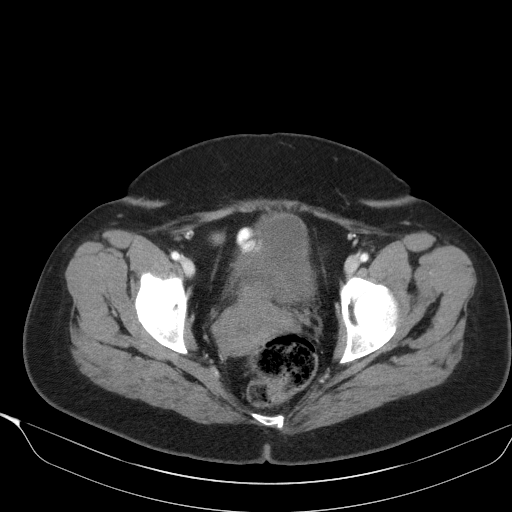
[im 28/89  soft-tissue]
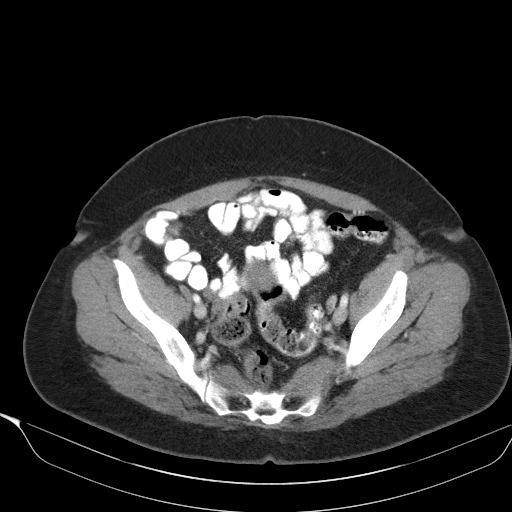
[im 34/89  soft-tissue]
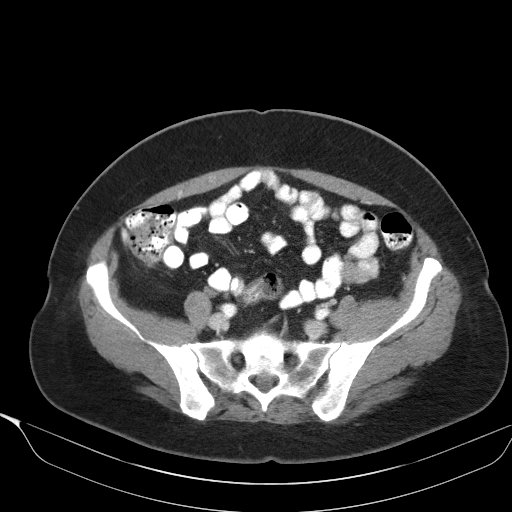
[im 41/89  soft-tissue]
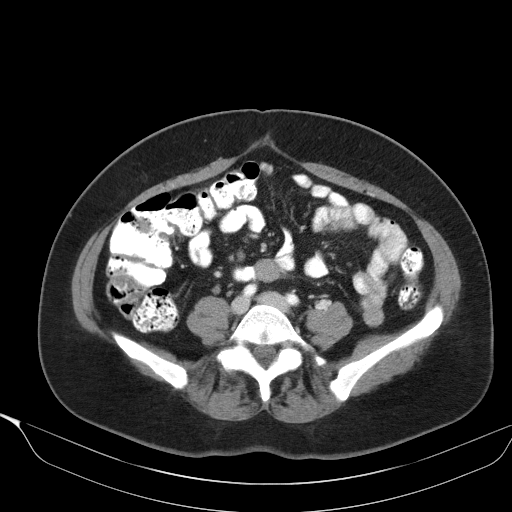
[im 48/89  soft-tissue]
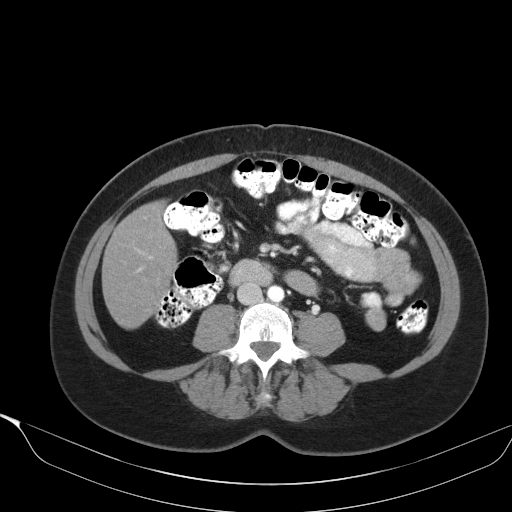
[im 55/89  soft-tissue]
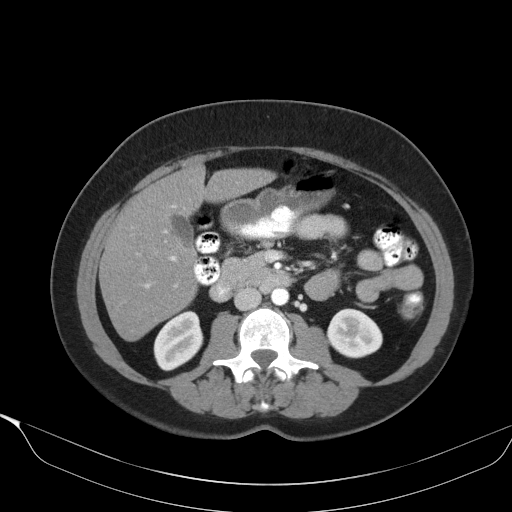
[im 61/89  soft-tissue]
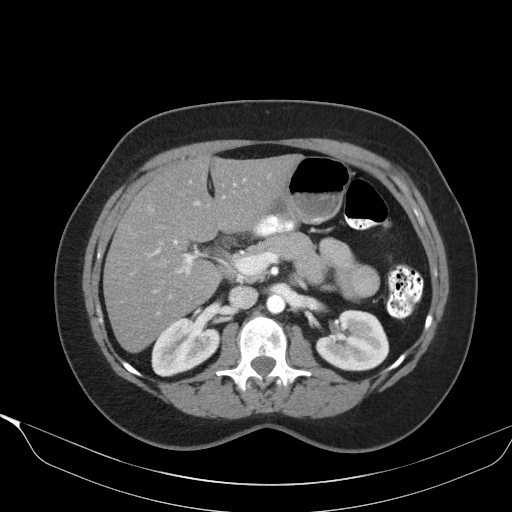
[im 61/89  lung]
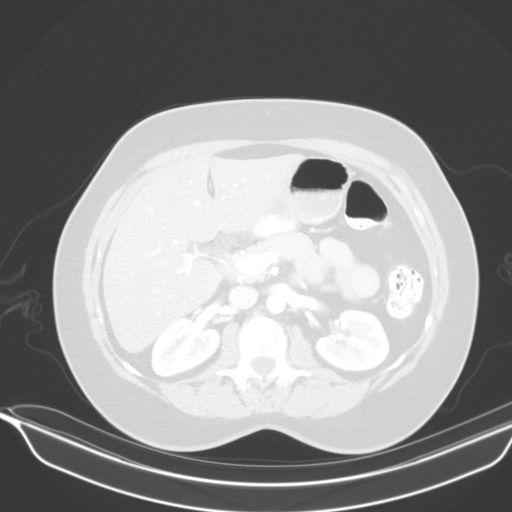
[im 61/89  bone]
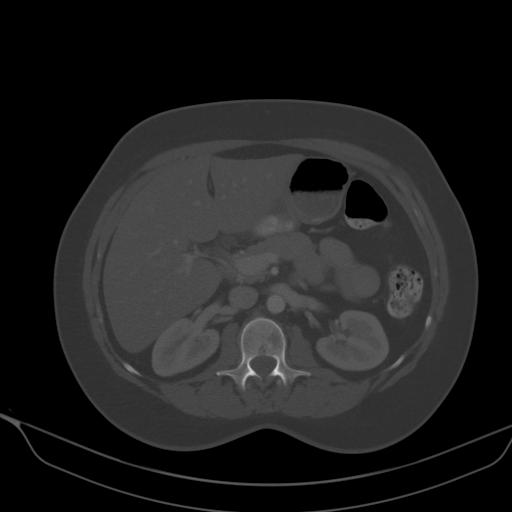
[im 68/89  soft-tissue]
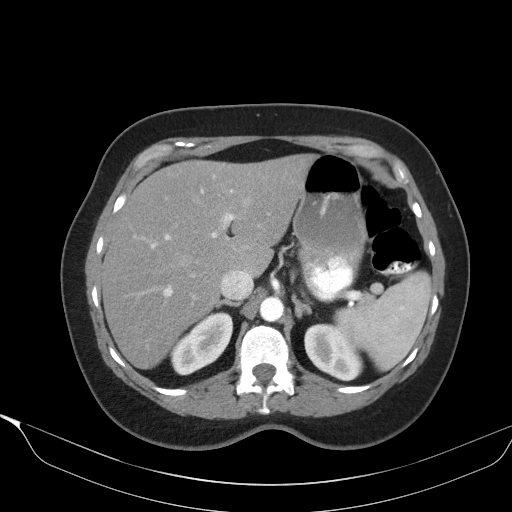
[im 68/89  lung]
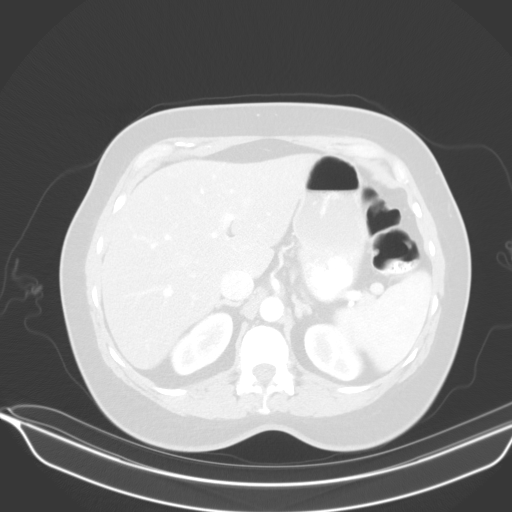
[im 75/89  soft-tissue]
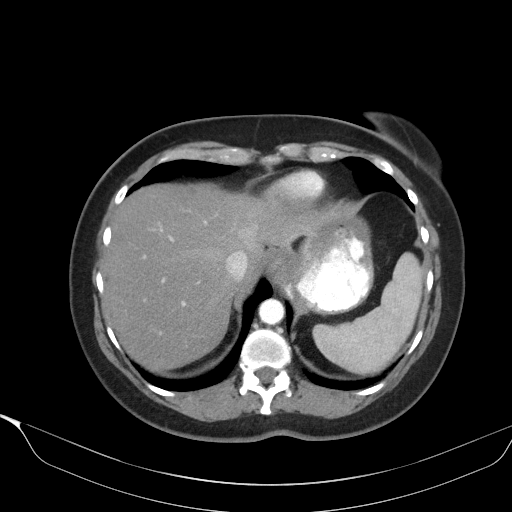
[im 75/89  lung]
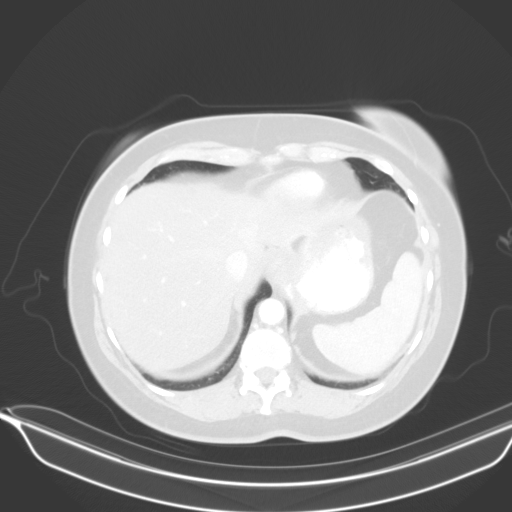
[im 82/89  soft-tissue]
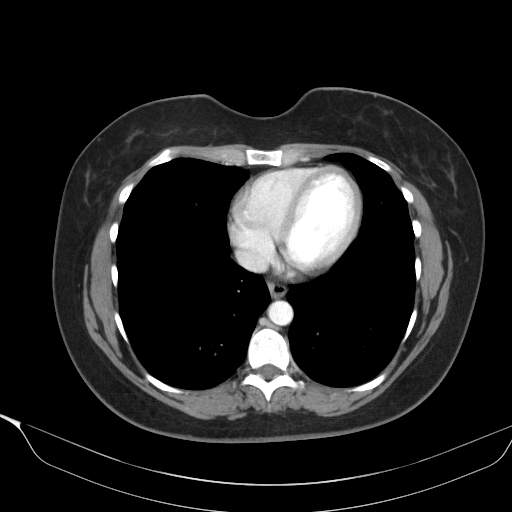
[im 82/89  lung]
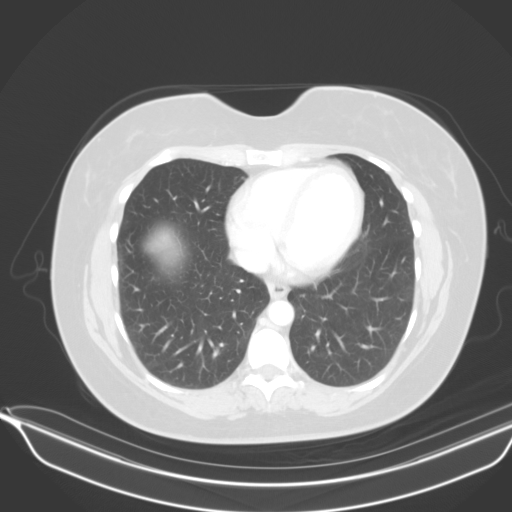

[12 of 32 positions shown; findings below may reference images not displayed]

RADIATION DOSE REDUCTION: This exam was performed according to the
departmental dose-optimization program which includes automated
exposure control, adjustment of the mA and/or kV according to
patient size and/or use of iterative reconstruction technique.

CONTRAST:  100mL P3WERX-3HH IOPAMIDOL (P3WERX-3HH) INJECTION 61%
FINDINGS: Lower chest: No acute abnormality.

Hepatobiliary: There is diffuse fatty infiltration of the liver. No
gallstones are identified. There is no biliary ductal dilatation.

Pancreas: Unremarkable. No pancreatic ductal dilatation or
surrounding inflammatory changes.

Spleen: Normal in size without focal abnormality.

Adrenals/Urinary Tract: There is a rounded hypodensity in the left
kidney which is too small to characterize, likely a cyst. Otherwise,
the kidneys, adrenal glands and bladder are within normal limits.

Stomach/Bowel: There are surgical changes at the gastroesophageal
junction. Stomach is otherwise within normal limits. Appendix
appears normal. No evidence of bowel wall thickening, distention, or
inflammatory changes. There is a large amount of stool throughout
the colon.

Vascular/Lymphatic: No significant vascular findings are present. No
enlarged abdominal or pelvic lymph nodes.

Reproductive: Uterus and bilateral adnexa are unremarkable.

Other: No abdominal wall hernia or abnormality. No abdominopelvic
ascites.

Musculoskeletal: No acute or significant osseous findings.
IMPRESSION: 1. No acute localizing process in the abdomen or pelvis.
2. Surgical changes at the gastroesophageal junction.
3. Fatty infiltration of the liver.
4. Large stool burden.

## 2023-07-27 IMAGING — US US THYROID
1 series · 14 of 25 positions shown · non-contrast
Comparison: None.

CLINICAL DATA: The neck swelling on physical examination

Dysphagia for several months
EXAM:
THYROID ULTRASOUND
TECHNIQUE: Ultrasound examination of the thyroid gland and adjacent soft
tissues was performed.

[Series 1: us thyroid · 0.05mm/px · 14 of 49 slices shown]
[im 1/49]
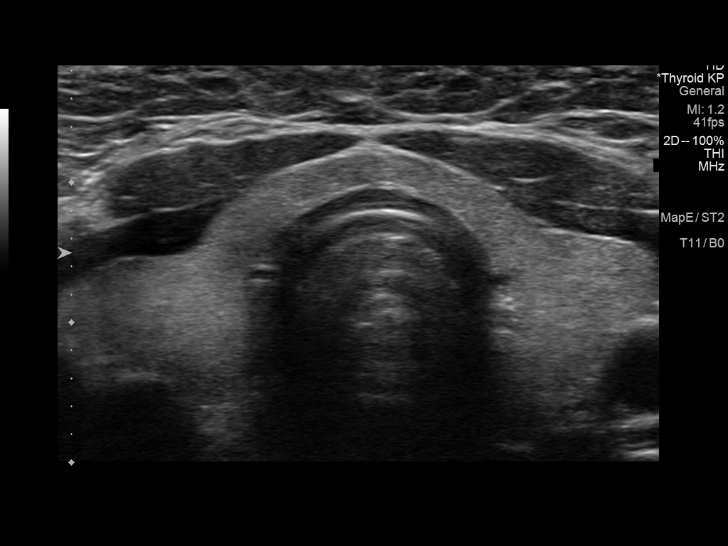
[im 5/49]
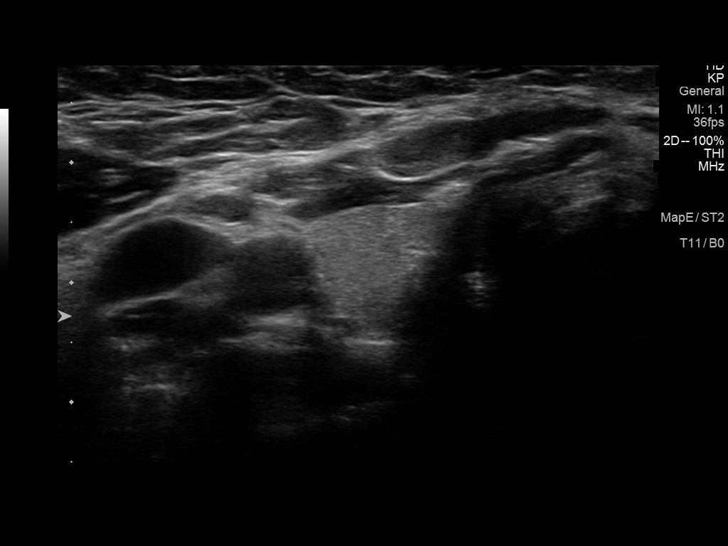
[im 9/49]
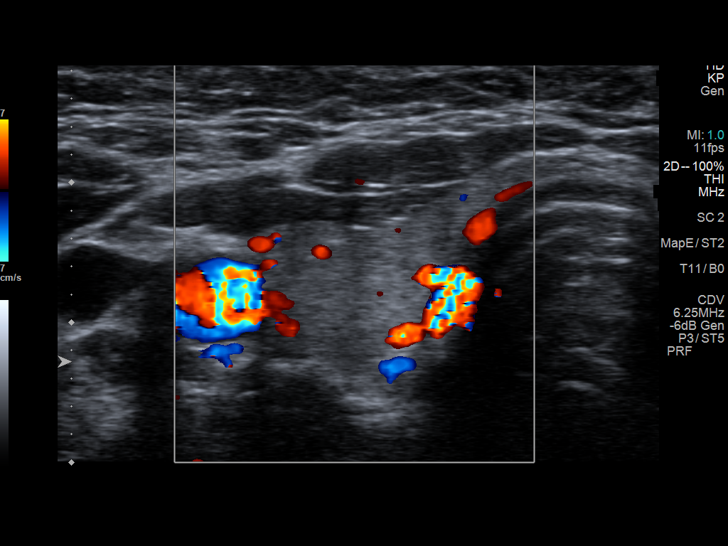
[im 13/49]
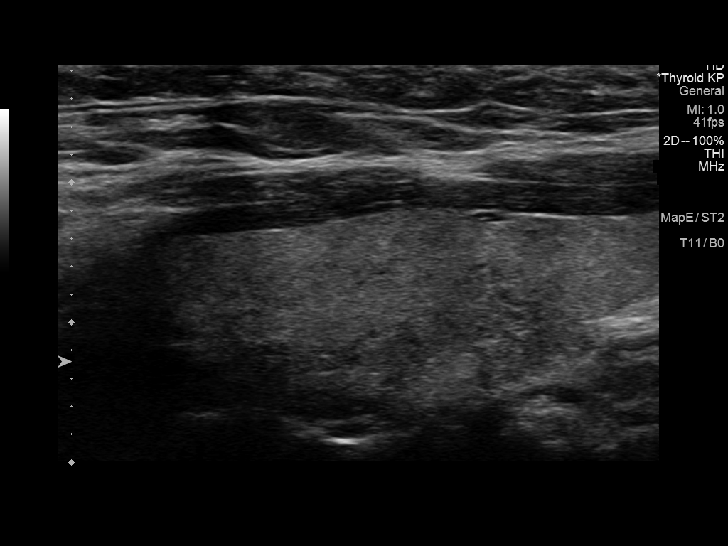
[im 17/49]
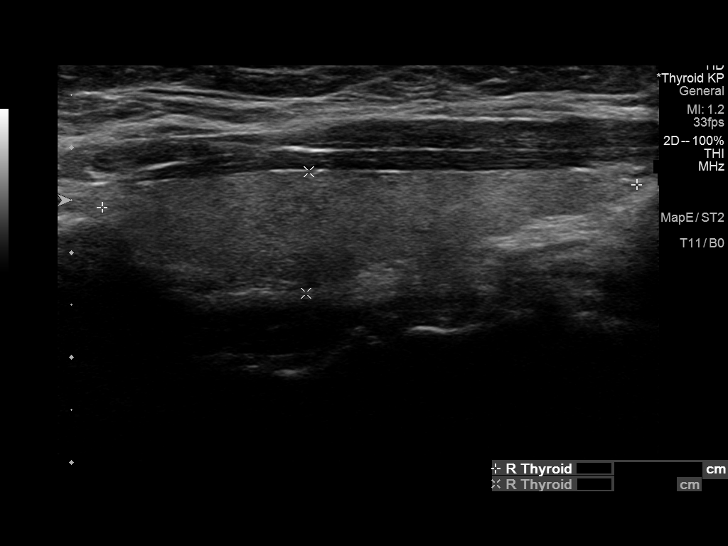
[im 19/49]
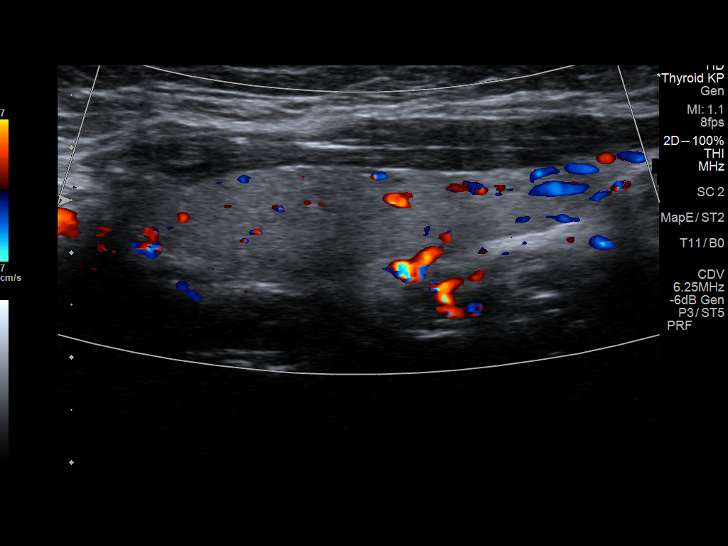
[im 23/49]
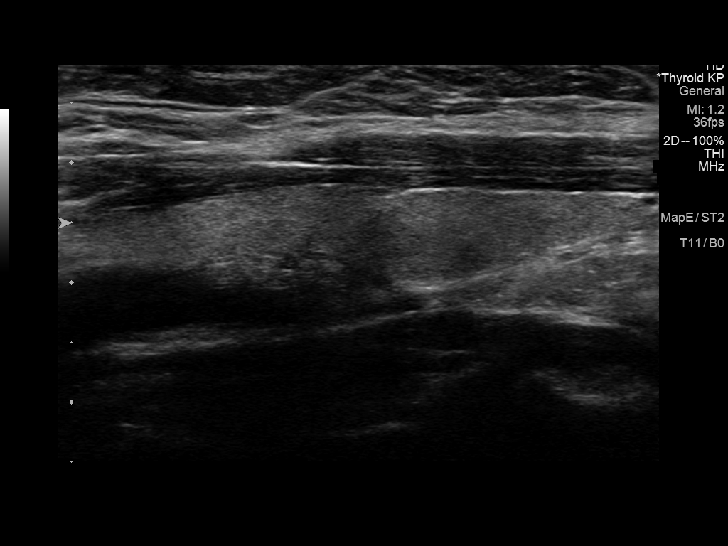
[im 27/49]
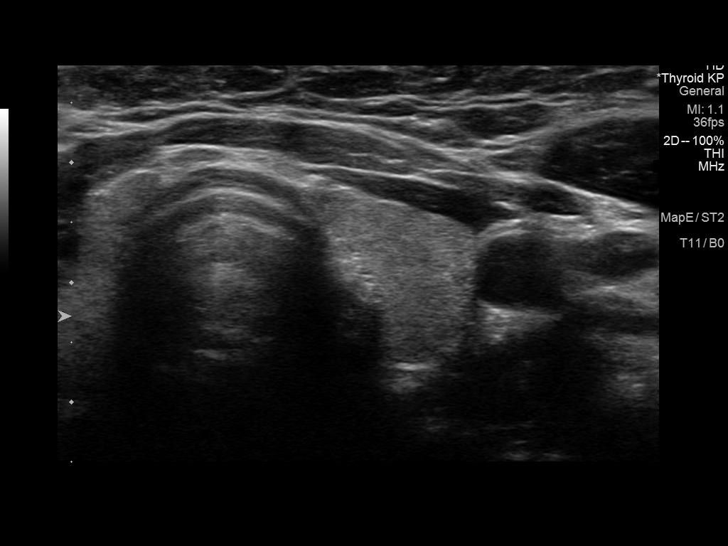
[im 31/49]
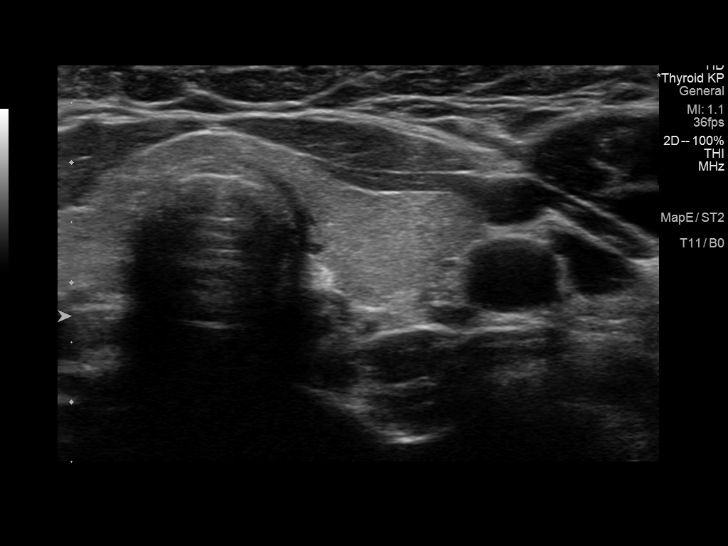
[im 33/49]
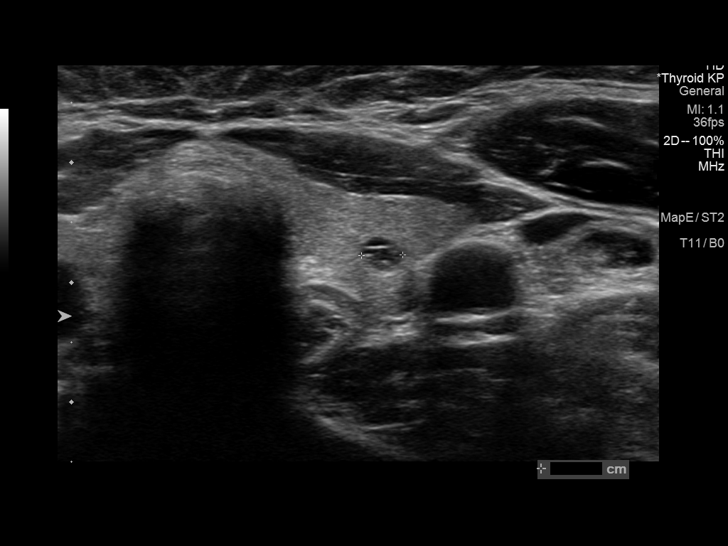
[im 37/49]
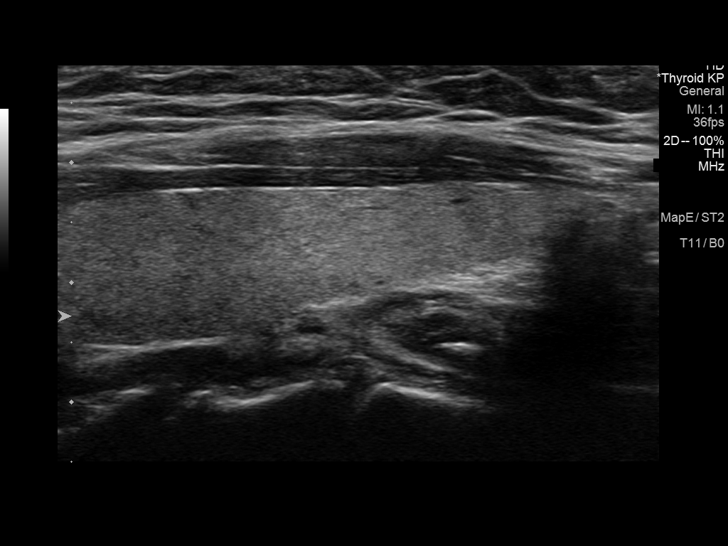
[im 41/49]
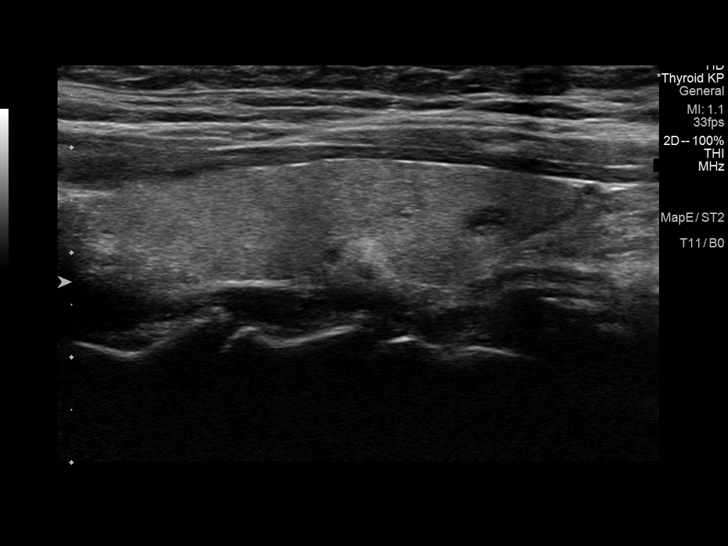
[im 45/49]
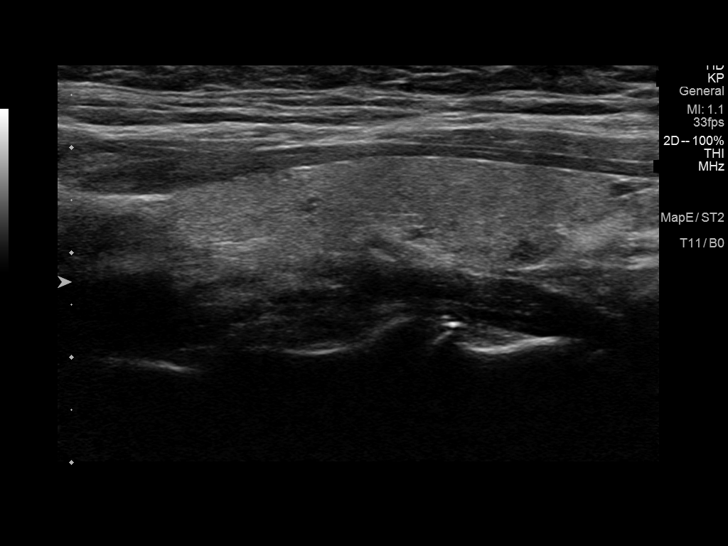
[im 49/49]
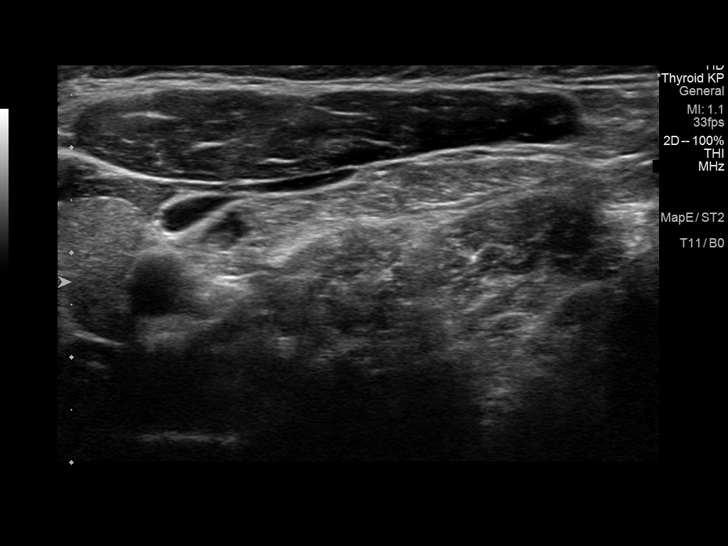

[14 of 25 positions shown; findings below may reference images not displayed]

FINDINGS: Parenchymal Echotexture: Mildly heterogenous

Isthmus: 0.3 cm

Right lobe: 5.1 x 1.2 x 1.6 cm

Left lobe: 5.1 x 1.2 x 1.3 cm

_________________________________________________________

Estimated total number of nodules >/= 1 cm: 0

Number of spongiform nodules >/=  2 cm not described below (TR1): 0

Number of mixed cystic and solid nodules >/= 1.5 cm not described
below (TR2): 0

_________________________________________________________

Subcentimeter spongiform left inferior thyroid nodule does not meet
criteria for imaging surveillance or FNA.
IMPRESSION: Mild diffuse heterogeneity of the thyroid parenchyma without
suspicious nodule.

The above is in keeping with the ACR TI-RADS recommendations - [HOSPITAL] 9187;[DATE].
